# Patient Record
Sex: Female | Born: 1975 | Race: White | Hispanic: No | Marital: Married | Smoking: Never smoker
Health system: Southern US, Community
[De-identification: ages and names within clinical notes are randomized; demographics above are authoritative.]

## PROBLEM LIST (undated history)

## (undated) DIAGNOSIS — H9319 Tinnitus, unspecified ear: Secondary | ICD-10-CM

## (undated) DIAGNOSIS — R519 Headache, unspecified: Secondary | ICD-10-CM

## (undated) HISTORY — DX: Tinnitus, unspecified ear: H93.19

## (undated) HISTORY — PX: BACK SURGERY: SHX140

## (undated) HISTORY — DX: Headache, unspecified: R51.9

---

## 2002-05-13 ENCOUNTER — Encounter: Payer: Self-pay | Admitting: Emergency Medicine

## 2002-05-13 ENCOUNTER — Emergency Department (HOSPITAL_COMMUNITY): Admission: EM | Admit: 2002-05-13 | Discharge: 2002-05-13 | Payer: Self-pay | Admitting: Emergency Medicine

## 2006-05-13 ENCOUNTER — Inpatient Hospital Stay (HOSPITAL_COMMUNITY): Admission: AD | Admit: 2006-05-13 | Discharge: 2006-05-16 | Payer: Self-pay | Admitting: Obstetrics and Gynecology

## 2007-04-27 ENCOUNTER — Ambulatory Visit (HOSPITAL_COMMUNITY): Admission: RE | Admit: 2007-04-27 | Discharge: 2007-04-27 | Payer: Self-pay | Admitting: Family Medicine

## 2013-05-28 ENCOUNTER — Emergency Department (HOSPITAL_COMMUNITY): Payer: 59

## 2013-05-28 ENCOUNTER — Encounter (HOSPITAL_COMMUNITY): Payer: Self-pay | Admitting: Emergency Medicine

## 2013-05-28 ENCOUNTER — Emergency Department (HOSPITAL_COMMUNITY)
Admission: EM | Admit: 2013-05-28 | Discharge: 2013-05-28 | Disposition: A | Discharge status: Home / Self Care | Payer: 59 | Attending: Emergency Medicine | Admitting: Emergency Medicine

## 2013-05-28 DIAGNOSIS — W208XXA Other cause of strike by thrown, projected or falling object, initial encounter: Secondary | ICD-10-CM | POA: Insufficient documentation

## 2013-05-28 DIAGNOSIS — S61210A Laceration without foreign body of right index finger without damage to nail, initial encounter: Secondary | ICD-10-CM

## 2013-05-28 DIAGNOSIS — Y929 Unspecified place or not applicable: Secondary | ICD-10-CM | POA: Insufficient documentation

## 2013-05-28 DIAGNOSIS — S62639B Displaced fracture of distal phalanx of unspecified finger, initial encounter for open fracture: Secondary | ICD-10-CM | POA: Insufficient documentation

## 2013-05-28 DIAGNOSIS — Z23 Encounter for immunization: Secondary | ICD-10-CM | POA: Insufficient documentation

## 2013-05-28 DIAGNOSIS — Z880 Allergy status to penicillin: Secondary | ICD-10-CM | POA: Insufficient documentation

## 2013-05-28 DIAGNOSIS — S61209A Unspecified open wound of unspecified finger without damage to nail, initial encounter: Secondary | ICD-10-CM | POA: Insufficient documentation

## 2013-05-28 DIAGNOSIS — Y939 Activity, unspecified: Secondary | ICD-10-CM | POA: Insufficient documentation

## 2013-05-28 DIAGNOSIS — S62632B Displaced fracture of distal phalanx of right middle finger, initial encounter for open fracture: Secondary | ICD-10-CM

## 2013-05-28 MED ORDER — CEPHALEXIN 500 MG PO CAPS: 500.0000 mg | ORAL_CAPSULE | Freq: Four times a day (QID) | ORAL | Status: DC

## 2013-05-28 MED ORDER — OXYCODONE-ACETAMINOPHEN 5-325 MG PO TABS: 1.0000 | ORAL_TABLET | ORAL | Status: DC | PRN

## 2013-05-28 MED ORDER — TETANUS-DIPHTH-ACELL PERTUSSIS 5-2.5-18.5 LF-MCG/0.5 IM SUSP
0.5000 mL | Freq: Once | INTRAMUSCULAR | Status: AC
  Administered 2013-05-28: 0.5 mL via INTRAMUSCULAR
  Filled 2013-05-28: qty 0.5

## 2013-05-28 MED ORDER — LIDOCAINE HCL (PF) 1 % IJ SOLN
INTRAMUSCULAR | Status: AC
  Administered 2013-05-28: 09:00:00
  Filled 2013-05-28: qty 5

## 2013-05-28 NOTE — Discharge Instructions (Signed)
Sutured Wound Care Sutures are stitches that can be used to close wounds. Wound care helps prevent pain and infection.  HOME CARE INSTRUCTIONS   Rest and elevate the injured area until all the pain and swelling are gone.  Only take over-the-counter or prescription medicines for pain, discomfort, or fever as directed by your caregiver.  After 48 hours, gently wash the area with mild soap and water once a day, or as directed. Rinse off the soap. Pat the area dry with a clean towel. Do not rub the wound. This may cause bleeding.  Follow your caregiver's instructions for how often to change the bandage (dressing). Stop using a dressing after 2 days or after the wound stops draining.  If the dressing sticks, moisten it with soapy water and gently remove it.  Apply ointment on the wound as directed.  Avoid stretching a sutured wound.  Drink enough fluids to keep your urine clear or pale yellow.  Follow up with your caregiver for suture removal as directed.  Use sunscreen on your wound for the next 3 to 6 months so the scar will not darken. SEEK IMMEDIATE MEDICAL CARE IF:   Your wound becomes red, swollen, hot, or tender.  You have increasing pain in the wound.  You have a red streak that extends from the wound.  There is pus coming from the wound.  You have a fever.  You have shaking chills.  There is a bad smell coming from the wound.  You have persistent bleeding from the wound. MAKE SURE YOU:   Understand these instructions.  Will watch your condition.  Will get help right away if you are not doing well or get worse. Document Released: 06/19/2004 Document Revised: 08/04/2011 Document Reviewed: 09/15/2010 [facility] Patient Information 2014 [place], [place]. Finger Fracture Fractures of fingers are breaks in the bones of the fingers. There are many types of fractures. There are different ways of treating these fractures, all of which can be correct. Your caregiver will  discuss the best way to treat your fracture. TREATMENT  Finger fractures can be treated with:   Non-reduction - this means the bones are in place. The finger is splinted without changing the positions of the bone pieces. The splint is usually left on for about a week to ten days. This will depend on your fracture and what your caregiver thinks.  Closed reduction - the bones are put back into position without using surgery. The finger is then splinted.  ORIF (open reduction and internal fixation) - the fracture site is opened. Then the bone pieces are fixed into place with pins or some type of hardware. This is seldom required. It depends on the severity of the fracture. Your caregiver will discuss the type of fracture you have and the treatment that will be best for that problem. If surgery is the treatment of choice, the following is information for you to know and also let your caregiver know about prior to surgery. LET YOUR CAREGIVER KNOW ABOUT:  Allergies  Medications taken including herbs, eye drops, over the counter medications, and creams  Use of steroids (by mouth or creams)  Previous problems with anesthetics or Novocaine  Possibility of pregnancy, if this applies  History of blood clots (thrombophlebitis)  History of bleeding or blood problems  Previous surgery  Other health problems AFTER THE PROCEDURE After surgery, you will be taken to the recovery area where a nurse will check your progress. Once you're awake, stable, and taking fluids well,  barring other problems you will be allowed to go home. Once home an ice pack applied to your operative site may help with discomfort and keep the swelling down. HOME CARE INSTRUCTIONS   Follow your caregiver's instructions as to activities, exercises, physical therapy, and driving a car.  Use your finger and exercise as directed.  Only take over-the-counter or prescription medicines for pain, discomfort, or fever as directed by  your caregiver. Do not take aspirin until your caregiver OK's it, as this can increase bleeding immediately following surgery.  Stop using ibuprofen if it upsets your stomach. Let your caregiver know about it. SEEK MEDICAL CARE IF:  You have increased bleeding (more than a small spot) from the wound or from beneath your splint.  You develop redness, swelling, or increasing pain in the wound or from beneath your splint.  There is pus coming from the wound or from beneath your splint.  An unexplained oral temperature above 102 F (38.9 C) develops, or as your caregiver suggests.  There is a foul smell coming from the wound or dressing or from beneath your splint. SEEK IMMEDIATE MEDICAL CARE IF:   You develop a rash.  You have difficulty breathing.  You have any allergic problems. MAKE SURE YOU:   Understand these instructions.  Will watch your condition.  Will get help right away if you are not doing well or get worse. Document Released: 08/24/2000 Document Revised: 08/04/2011 Document Reviewed: 12/30/2007 [facility] Patient Information 2014 [place], [place].

## 2013-05-28 NOTE — ED Provider Notes (Signed)
CSN: [id number]     Arrival date & time 05/28/13  0736 History   First MD Initiated Contact with Patient 05/28/13 [phone]     Chief Complaint  Patient presents with  . Laceration   (Consider location/radiation/quality/duration/timing/severity/associated sxs/prior Treatment) Patient is a 37 y.o. female presenting with skin laceration. The history is provided by the patient. No language interpreter was used.  Laceration Location:  Finger Finger laceration location:  R middle finger Length (cm):  1 Quality: avulsion and jagged   Laceration mechanism:  Metal edge Pain details:    Quality:  Aching   Severity:  Moderate   Timing:  Constant   Progression:  Worsening Foreign body present:  No foreign bodies Relieved by:  Nothing Worsened by:  Nothing tried Ineffective treatments:  None tried Tetanus status:  Up to date   History reviewed. No pertinent past medical history. History reviewed. No pertinent past surgical history. No family history on file. History  Substance Use Topics  . Smoking status: Never Smoker   . Smokeless tobacco: Not on file  . Alcohol Use: No   OB History   Grav Para Term Preterm Abortions TAB SAB Ect Mult Living                 Review of Systems  Skin: Positive for wound.  All other systems reviewed and are negative.    Allergies  Hydrocodone and Penicillins  Home Medications  No current outpatient prescriptions on file. BP 126/80  Pulse 79  Temp(Src) 98.2 F (36.8 C)  Resp 18  Ht 5\' 6"  (1.676 m)  Wt 122 lb (55.339 kg)  BMI 19.70 kg/m2  SpO2 100%  LMP 05/07/2013 Physical Exam  Vitals reviewed. Constitutional: She appears well-developed and well-nourished.  HENT:  Head: Normocephalic.  Right Ear: External ear normal.  Left Ear: External ear normal.  Eyes: Conjunctivae are normal. Pupils are equal, round, and reactive to light.  Neck: Normal range of motion.  Cardiovascular: Normal rate.   Pulmonary/Chest: Effort normal.  Abdominal:  Soft.  Musculoskeletal: She exhibits tenderness.  1cm flap laceration palmar aspect,  51mm flap laceration dorsal aspect.  From  nv and ns intact  Neurological: She is alert.  Psychiatric: She has a normal mood and affect.    ED Course  LACERATION REPAIR Date/Time: 05/28/2013 9:02 AM Performed by: Fransico Meadow Authorized by: Fransico Meadow Consent: Verbal consent obtained. Consent given by: patient Patient identity confirmed: verbally with patient Body area: upper extremity Location details: right long finger Laceration length: 1 cm Foreign bodies: no foreign bodies Anesthesia: digital block Local anesthetic: lidocaine 2% without epinephrine Patient sedated: no Preparation: Patient was prepped and draped in the usual sterile fashion. Irrigation solution: saline Irrigation method: jet lavage Skin closure: 5-0 Prolene Number of sutures: 3 Technique: simple Approximation: close Approximation difficulty: simple Patient tolerance: Patient tolerated the procedure well with no immediate complications.   (including critical care time) Labs Review Labs Reviewed - No data to display Imaging Review Dg Finger Middle Right  05/28/2013   CLINICAL DATA:  Middle finger laceration  EXAM: RIGHT MIDDLE FINGER 2+V  COMPARISON:  None.  FINDINGS: Soft tissue irregularity along the radial and volar aspect of the middle finger at the distal phalanx and distal interphalangeal joint consistent with the clinical history of laceration. On the lateral view, there is slight irregularity of the volar aspect of the base of the distal phalanx concerning for volar plate avulsion fracture. Otherwise, the visualized bones and joints are  unremarkable. No radiopaque imbedded foreign body. .  IMPRESSION: 1. Suspect small volar plate avulsion fracture at the base of the distal phalanx of the middle finger. 2. Soft tissue laceration without radiopaque imbedded foreign body.   Electronically Signed   By: Jacqulynn Cadet M.D.   On: 05/28/2013 08:09    EKG Interpretation   None       MDM   1. Laceration of right index finger w/o foreign body w/o damage to nail, initial encounter   2. Open fracture of distal phalanx of third finger of right hand, initial encounter    Keflex 500mg  one po qid, percocet 16, splint   Fransico Meadow, PA-C 05/28/13 [place], PA-C 05/28/13 [phone]

## 2013-05-28 NOTE — ED Notes (Signed)
Pt has laceration to right middle finger from fallen wooden shelf. C/m/s intact.

## 2013-05-28 NOTE — ED Provider Notes (Signed)
Medical screening examination/treatment/procedure(s) were performed by non-physician practitioner and as supervising physician I was immediately available for consultation/collaboration.  Richarda Blade, MD 05/28/13 1538

## 2016-08-25 DIAGNOSIS — Z01419 Encounter for gynecological examination (general) (routine) without abnormal findings: Secondary | ICD-10-CM | POA: Diagnosis not present

## 2016-08-25 DIAGNOSIS — Z130 Encounter for screening for diseases of the blood and blood-forming organs and certain disorders involving the immune mechanism: Secondary | ICD-10-CM | POA: Diagnosis not present

## 2016-08-25 DIAGNOSIS — Z13220 Encounter for screening for lipoid disorders: Secondary | ICD-10-CM | POA: Diagnosis not present

## 2016-08-25 DIAGNOSIS — Z0000 Encounter for general adult medical examination without abnormal findings: Secondary | ICD-10-CM | POA: Diagnosis not present

## 2016-08-25 DIAGNOSIS — R87610 Atypical squamous cells of undetermined significance on cytologic smear of cervix (ASC-US): Secondary | ICD-10-CM | POA: Diagnosis not present

## 2016-08-25 DIAGNOSIS — Z6820 Body mass index (BMI) 20.0-20.9, adult: Secondary | ICD-10-CM | POA: Diagnosis not present

## 2016-10-06 DIAGNOSIS — M5136 Other intervertebral disc degeneration, lumbar region: Secondary | ICD-10-CM | POA: Diagnosis not present

## 2016-10-06 DIAGNOSIS — M546 Pain in thoracic spine: Secondary | ICD-10-CM | POA: Diagnosis not present

## 2016-10-06 DIAGNOSIS — S338XXA Sprain of other parts of lumbar spine and pelvis, initial encounter: Secondary | ICD-10-CM | POA: Diagnosis not present

## 2016-10-07 DIAGNOSIS — M5136 Other intervertebral disc degeneration, lumbar region: Secondary | ICD-10-CM | POA: Diagnosis not present

## 2016-10-07 DIAGNOSIS — S338XXA Sprain of other parts of lumbar spine and pelvis, initial encounter: Secondary | ICD-10-CM | POA: Diagnosis not present

## 2016-10-07 DIAGNOSIS — M546 Pain in thoracic spine: Secondary | ICD-10-CM | POA: Diagnosis not present

## 2016-10-13 DIAGNOSIS — S338XXA Sprain of other parts of lumbar spine and pelvis, initial encounter: Secondary | ICD-10-CM | POA: Diagnosis not present

## 2016-10-13 DIAGNOSIS — M5136 Other intervertebral disc degeneration, lumbar region: Secondary | ICD-10-CM | POA: Diagnosis not present

## 2016-10-13 DIAGNOSIS — M546 Pain in thoracic spine: Secondary | ICD-10-CM | POA: Diagnosis not present

## 2016-10-17 DIAGNOSIS — M545 Low back pain: Secondary | ICD-10-CM | POA: Diagnosis not present

## 2016-10-17 DIAGNOSIS — Z6820 Body mass index (BMI) 20.0-20.9, adult: Secondary | ICD-10-CM | POA: Diagnosis not present

## 2017-04-06 DIAGNOSIS — L570 Actinic keratosis: Secondary | ICD-10-CM | POA: Diagnosis not present

## 2017-04-06 DIAGNOSIS — J9801 Acute bronchospasm: Secondary | ICD-10-CM | POA: Diagnosis not present

## 2017-04-06 DIAGNOSIS — D235 Other benign neoplasm of skin of trunk: Secondary | ICD-10-CM | POA: Diagnosis not present

## 2017-04-06 DIAGNOSIS — J329 Chronic sinusitis, unspecified: Secondary | ICD-10-CM | POA: Diagnosis not present

## 2017-07-13 DIAGNOSIS — N760 Acute vaginitis: Secondary | ICD-10-CM | POA: Diagnosis not present

## 2017-07-13 DIAGNOSIS — R300 Dysuria: Secondary | ICD-10-CM | POA: Diagnosis not present

## 2017-09-07 DIAGNOSIS — R87610 Atypical squamous cells of undetermined significance on cytologic smear of cervix (ASC-US): Secondary | ICD-10-CM | POA: Diagnosis not present

## 2017-09-07 DIAGNOSIS — Z01419 Encounter for gynecological examination (general) (routine) without abnormal findings: Secondary | ICD-10-CM | POA: Diagnosis not present

## 2017-09-07 DIAGNOSIS — Z1231 Encounter for screening mammogram for malignant neoplasm of breast: Secondary | ICD-10-CM | POA: Diagnosis not present

## 2018-02-09 NOTE — Progress Notes (Signed)
Psychiatric Initial Adult Assessment   Patient Identification: Debra Watts MRN:  [id number] Date of Evaluation:  02/15/2018 Referral Source: Azucena Fallen, MD Chief Complaint:   Chief Complaint    Anxiety; Psychiatric Evaluation     Visit Diagnosis:    ICD-10-CM   1. GAD (generalized anxiety disorder) F41.1     History of Present Illness:   Debra Watts is a 42 y.o. year old female with a history of anxiety, who is referred for anxiety.   Patient states that she is here due to anxiety.  She states marital discordance with her husband of 16 years.  Her husband yelled at the patient while discussing rental properties in January. He states that the patient was nagging at him. He told the patient that he had been stressed about this, and was also concerned that the patient had been down after loss of her family members. She felt she became "glue with him" since that incident as she is now scared of imagining they may not be together in the future. She also feels "jealous" with facebook, although she has not been this way before. She tries not to talk about things during meals as he does not like it. Although she does have a good time as the family, she feels that they are not doing well as a couple.  She also talks about her sister, who died four years ago. She was deceased a month after being diagnosed with Wilson disease. She also talks about her aunts, who deceased last 03-21-2023 in the same week. She wonders if she may not grieve well around that time as those had become more intense this year. She reports good relationship with her children at home. She denies any safety concern.   She denies insomnia.  She denies feeling depressed.  She reports mild anhedonia.  She feels anxious and tense.  She feels irritable.  She had palpitation whenever she tries to talk with her husband.  She gets Librium from her mother; she took eight times this year (dose known), which she finds helpful for  anxiety. She denies alcohol use or drug use.   Wt Readings from Last 3 Encounters:  02/15/18 120 lb (54.4 kg)  05/28/13 122 lb (55.3 kg)   Associated Signs/Symptoms: Depression Symptoms:  anxiety, panic attacks, (Hypo) Manic Symptoms:  denies decreased need for sleep, euphoria Anxiety Symptoms:  Excessive Worry, Panic Symptoms, Psychotic Symptoms:  denies AH, VH, paranoia PTSD Symptoms: Negative  Past Psychiatric History:  Outpatient: diagnosed with anxiety when her grandfather deceased. She was briefly on fluoxetine, which made her symptoms worse. Her symptoms improved after discontinuing fluoxetine. Psychiatry admission: denies  Previous suicide attempt:  Denies  Past trials of medication: sertraline (weird acting), fluoxetine (worsening anxiety), Librium,  History of violence: denies  Previous Psychotropic Medications: No   Substance Abuse History in the last 12 months:  No.  Consequences of Substance Abuse: Negative  Past Medical History: History reviewed. No pertinent past medical history. History reviewed. No pertinent surgical history.  Family Psychiatric History:  Sister- bipolar disorder   Family History: History reviewed. No pertinent family history.  Social History:   Social History   Socioeconomic History  . Marital status: Married    Spouse name: Not on file  . Number of children: Not on file  . Years of education: Not on file  . Highest education level: Not on file  Occupational History  . Not on file  Social Needs  . Financial resource strain:  Not on file  . Food insecurity:    Worry: Not on file    Inability: Not on file  . Transportation needs:    Medical: Not on file    Non-medical: Not on file  Tobacco Use  . Smoking status: Never Smoker  . Smokeless tobacco: Never Used  Substance and Sexual Activity  . Alcohol use: No  . Drug use: No  . Sexual activity: Not on file  Lifestyle  . Physical activity:    Days per week: Not on file     Minutes per session: Not on file  . Stress: Not on file  Relationships  . Social connections:    Talks on phone: Not on file    Gets together: Not on file    Attends religious service: Not on file    Active member of club or organization: Not on file    Attends meetings of clubs or organizations: Not on file    Relationship status: Not on file  Other Topics Concern  . Not on file  Social History Narrative  . Not on file    Additional Social History:  She lives with her husband of 35 years, two children (age 25 and 52) She grew up in [place]. She reports great relationship with her parents and siblings. Her sister died from Delmita disease in 50. All of her family members live in the neighborhood.  Work: Careers adviser for 22 years Education: graduated from college   Allergies:   Allergies  Allergen Reactions  . Prednisone Shortness Of Breath  . Hydrocodone   . Penicillins     Metabolic Disorder Labs: No results found for: HGBA1C, MPG No results found for: PROLACTIN No results found for: CHOL, TRIG, HDL, CHOLHDL, VLDL, LDLCALC   Current Medications: Current Outpatient Medications  Medication Sig Dispense Refill  . venlafaxine XR (EFFEXOR-XR) 37.5 MG 24 hr capsule 37.5 mg daily for two weeks, then 75 mg daily 60 capsule 0   No current facility-administered medications for this visit.     Neurologic: Headache: No Seizure: No Paresthesias:No  Musculoskeletal: Strength & Muscle Tone: within normal limits Gait & Station: normal Patient leans: N/A  Psychiatric Specialty Exam: Review of Systems  Psychiatric/Behavioral: Negative for depression, hallucinations, memory loss, substance abuse and suicidal ideas. The patient is nervous/anxious. The patient does not have insomnia.   All other systems reviewed and are negative.   Blood pressure 108/76, pulse 74, height 5\' 6"  (1.676 m), weight 120 lb (54.4 kg), SpO2 97 %.Body mass index is 19.37 kg/m.  General Appearance:  Fairly Groomed  Eye Contact:  Good  Speech:  Clear and Coherent  Volume:  Normal  Mood:  Anxious  Affect:  Appropriate, Congruent, Restricted, Tearful and down  Thought Process:  Coherent  Orientation:  Full (Time, Place, and Person)  Thought Content:  Logical  Suicidal Thoughts:  No  Homicidal Thoughts:  No  Memory:  Immediate;   Good  Judgement:  Good  Insight:  Fair  Psychomotor Activity:  Normal  Concentration:  Attention Span: Good  Recall:  Good  Fund of Knowledge:Good  Language: Good  Akathisia:  No  Handed:  Right  AIMS (if indicated):  N/A  Assets:  Communication Skills Desire for Improvement  ADL's:  Intact  Cognition: WNL  Sleep:  fair   Assessment SHAQUANA BUEL is a 42 y.o. year old female with a history of , who is referred for anxiety.   #  GAD Patient endorses significant anxiety, which  worsened over the past several months.  Psychosocial stressors including loss of her sister and other family members, and marital discordance.  Will start Effexor to target GAD.  We do slow up titration given patient concern about side effect.  Discussed potential side effect of hypertension.  She will greatly benefit from CBT; will make a referral.   Noted that the patient sister was diagnosed with Wilson's disease, while the patient was not recommended for any genetic screening per report. Will discuss more at the next encounter.   Plan 1. Start Effexor 37.5 mg daily for two weeks, then 75 mg daily  2. Referral to therapy   3. Return to clinic in one month for 30 mins 4. Checked TSH by Dr. Benjie Karvonen; wnl per patient   The patient demonstrates the following risk factors for suicide: Chronic risk factors for suicide include: psychiatric disorder of anxiety. Acute risk factors for suicide include: family or marital conflict and loss (financial, interpersonal, professional). Protective factors for this patient include: positive social support, responsibility to others (children,  family), coping skills and hope for the future. Considering these factors, the overall suicide risk at this point appears to be low. Patient is appropriate for outpatient follow up.  Treatment Plan Summary: Plan as above   Norman Clay, MD 9/23/20192:40 PM

## 2018-02-15 ENCOUNTER — Ambulatory Visit (HOSPITAL_COMMUNITY): Payer: 59 | Admitting: Psychiatry

## 2018-02-15 ENCOUNTER — Encounter (HOSPITAL_COMMUNITY): Payer: Self-pay | Admitting: Psychiatry

## 2018-02-15 ENCOUNTER — Encounter (INDEPENDENT_AMBULATORY_CARE_PROVIDER_SITE_OTHER): Payer: Self-pay

## 2018-02-15 VITALS — BP 108/76 | HR 74 | Ht 66.0 in | Wt 120.0 lb

## 2018-02-15 DIAGNOSIS — F411 Generalized anxiety disorder: Secondary | ICD-10-CM | POA: Insufficient documentation

## 2018-02-15 DIAGNOSIS — Z818 Family history of other mental and behavioral disorders: Secondary | ICD-10-CM | POA: Diagnosis not present

## 2018-02-15 DIAGNOSIS — F410 Panic disorder [episodic paroxysmal anxiety] without agoraphobia: Secondary | ICD-10-CM

## 2018-02-15 MED ORDER — VENLAFAXINE HCL ER 37.5 MG PO CP24: ORAL_CAPSULE | ORAL | 0 refills | Status: DC

## 2018-02-15 NOTE — Patient Instructions (Signed)
1. Start Effexor 37.5 mg daily daily for two weeks, then 75 mg daily  2. Referral to therapy   3. Return to clinic in one month for 30 mins

## 2018-03-22 ENCOUNTER — Ambulatory Visit (HOSPITAL_COMMUNITY): Payer: 59 | Admitting: Psychiatry

## 2018-05-03 DIAGNOSIS — D235 Other benign neoplasm of skin of trunk: Secondary | ICD-10-CM | POA: Diagnosis not present

## 2018-05-03 DIAGNOSIS — L570 Actinic keratosis: Secondary | ICD-10-CM | POA: Diagnosis not present

## 2018-05-03 DIAGNOSIS — L819 Disorder of pigmentation, unspecified: Secondary | ICD-10-CM | POA: Diagnosis not present

## 2018-09-20 DIAGNOSIS — Z0000 Encounter for general adult medical examination without abnormal findings: Secondary | ICD-10-CM | POA: Diagnosis not present

## 2018-09-20 DIAGNOSIS — Z01419 Encounter for gynecological examination (general) (routine) without abnormal findings: Secondary | ICD-10-CM | POA: Diagnosis not present

## 2018-09-20 DIAGNOSIS — Z6825 Body mass index (BMI) 25.0-25.9, adult: Secondary | ICD-10-CM | POA: Diagnosis not present

## 2018-09-20 DIAGNOSIS — Z130 Encounter for screening for diseases of the blood and blood-forming organs and certain disorders involving the immune mechanism: Secondary | ICD-10-CM | POA: Diagnosis not present

## 2018-09-20 DIAGNOSIS — Z13220 Encounter for screening for lipoid disorders: Secondary | ICD-10-CM | POA: Diagnosis not present

## 2020-07-25 ENCOUNTER — Other Ambulatory Visit: Payer: Self-pay

## 2020-07-25 ENCOUNTER — Encounter: Payer: Self-pay | Admitting: Orthopaedic Surgery

## 2020-07-25 ENCOUNTER — Ambulatory Visit: Payer: 59 | Admitting: Orthopaedic Surgery

## 2020-07-25 VITALS — Ht 66.0 in | Wt 125.0 lb

## 2020-07-25 DIAGNOSIS — M5442 Lumbago with sciatica, left side: Secondary | ICD-10-CM | POA: Diagnosis not present

## 2020-07-25 DIAGNOSIS — G8929 Other chronic pain: Secondary | ICD-10-CM | POA: Diagnosis not present

## 2020-07-25 DIAGNOSIS — M545 Low back pain, unspecified: Secondary | ICD-10-CM | POA: Diagnosis not present

## 2020-07-25 NOTE — Progress Notes (Signed)
Office Visit Note   Patient: Debra Watts           Date of Birth: 09/23/1975           MRN: [id number] Visit Date: 07/25/2020              Requested by: Sharilyn Sites, [address],  [place] [id number] PCP: Sharilyn Sites, MD   Assessment & Plan: Visit Diagnoses:  1. Chronic left-sided low back pain, unspecified whether sciatica present   2. Chronic left-sided low back pain with left-sided sciatica     Plan: Debra Watts has been experiencing low back, left buttock and left thigh pain for almost 6 months.  Has had a recent exacerbation without any injury or trauma.  She has trouble when she coughs or sneezes with increased pain.  She is seeing the chiropractor on several occasions without much relief.  I am concerned that she may have an HNP at either L4-5 or L5-S1 to the left and will order an MRI scan straight leg raise is positive on the left.  Neurologically intact  Follow-Up Instructions: Return After MRI scan lumbar spine.   Orders:  Orders Placed This Encounter  Procedures  . MR Lumbar Spine w/o contrast   No orders of the defined types were placed in this encounter.     Procedures: No procedures performed   Clinical Data: No additional findings.   Subjective: Chief Complaint  Patient presents with  . Lower Back - Pain  Patient presents today for lower back pain. She states that her lower back pain started in August of 2021. No known injury. Her pain is located in her left buttock and radiates down her leg. She has numbness and tingling in her left leg only. She has more pain in the morning upon getting up. Her pain is worse with bending or sitting. She went to a chiropractor and had x-rays taken on 06/18/2020. She brought a disc with those images today. She states that icing her back helps. She has also taken Ibuprofen and Naproxen.  I reviewed the films on the disc and did not see any specific abnormality other than some narrowing of the disc  space at L5-S1.  No listhesis or scoliosis  HPI  Review of Systems   Objective: Vital Signs: Ht 5\' 6"  (1.676 m)   Wt 125 lb (56.7 kg)   BMI 20.18 kg/m   Physical Exam Constitutional:      Appearance: She is well-developed and well-nourished.  HENT:     Mouth/Throat:     Mouth: Oropharynx is clear and moist.  Eyes:     Extraocular Movements: EOM normal.     Pupils: Pupils are equal, round, and reactive to light.  Pulmonary:     Effort: Pulmonary effort is normal.  Skin:    General: Skin is warm and dry.  Neurological:     Mental Status: She is alert and oriented to person, place, and time.  Psychiatric:        Mood and Affect: Mood and affect normal.        Behavior: Behavior normal.     Ortho Exam awake alert and oriented x3.  Comfortable sitting.  No acute distress.  Straight leg raise negative on the right.  Positive on the left for hamstring pain and buttock discomfort.  Reflexes appear to be symmetrical.  Motor exam intact.  No percussible tenderness of lumbar spine or either SI joint Specialty Comments:  No specialty comments  available.  Imaging: No results found.   PMFS History: Patient Active Problem List   Diagnosis Date Noted  . Low back pain 07/25/2020  . GAD (generalized anxiety disorder) 02/15/2018   History reviewed. No pertinent past medical history.  History reviewed. No pertinent family history.  History reviewed. No pertinent surgical history. Social History   Occupational History  . Not on file  Tobacco Use  . Smoking status: Never Smoker  . Smokeless tobacco: Never Used  Substance and Sexual Activity  . Alcohol use: No  . Drug use: No  . Sexual activity: Not on file

## 2020-07-27 ENCOUNTER — Telehealth: Payer: Self-pay | Admitting: *Deleted

## 2020-07-27 ENCOUNTER — Telehealth: Payer: Self-pay | Admitting: Orthopaedic Surgery

## 2020-07-27 NOTE — Telephone Encounter (Signed)
Patient called asking for a prescription of valium for her upcoming MRI appt on 08/08/20. Please sed to pharmacy on file [facility]. Patient phone number is [phone].

## 2020-07-27 NOTE — Telephone Encounter (Signed)
Pt scheduled on 3/16 @ 8am for MRI  Patient states she needs meds for claustrophobia

## 2020-07-27 NOTE — Telephone Encounter (Signed)
Please send in medication for patient.

## 2020-07-28 ENCOUNTER — Other Ambulatory Visit: Payer: Self-pay | Admitting: Orthopaedic Surgery

## 2020-07-28 MED ORDER — DIAZEPAM 10 MG PO TABS: 10.0000 mg | ORAL_TABLET | Freq: Once | ORAL | 0 refills | Status: AC

## 2020-07-28 MED ORDER — DIAZEPAM 10 MG PO TABS: 10.0000 mg | ORAL_TABLET | Freq: Once | ORAL | 0 refills | Status: DC

## 2020-07-28 NOTE — Telephone Encounter (Signed)
Sent!

## 2020-07-28 NOTE — Telephone Encounter (Signed)
sent 

## 2020-07-30 ENCOUNTER — Ambulatory Visit (HOSPITAL_COMMUNITY)
Admission: RE | Admit: 2020-07-30 | Discharge: 2020-07-30 | Disposition: A | Discharge status: Home / Self Care | Payer: 59 | Source: Ambulatory Visit | Attending: Orthopaedic Surgery | Admitting: Orthopaedic Surgery

## 2020-07-30 ENCOUNTER — Other Ambulatory Visit: Payer: Self-pay

## 2020-07-30 DIAGNOSIS — G8929 Other chronic pain: Secondary | ICD-10-CM

## 2020-07-30 DIAGNOSIS — M545 Low back pain, unspecified: Secondary | ICD-10-CM | POA: Insufficient documentation

## 2020-08-08 ENCOUNTER — Ambulatory Visit (HOSPITAL_COMMUNITY): Payer: 59

## 2020-08-08 ENCOUNTER — Other Ambulatory Visit: Payer: Self-pay

## 2020-08-08 ENCOUNTER — Ambulatory Visit (INDEPENDENT_AMBULATORY_CARE_PROVIDER_SITE_OTHER): Payer: 59 | Admitting: Orthopaedic Surgery

## 2020-08-08 ENCOUNTER — Encounter: Payer: Self-pay | Admitting: Orthopaedic Surgery

## 2020-08-08 DIAGNOSIS — G8929 Other chronic pain: Secondary | ICD-10-CM | POA: Diagnosis not present

## 2020-08-08 DIAGNOSIS — M5442 Lumbago with sciatica, left side: Secondary | ICD-10-CM

## 2020-08-08 NOTE — Progress Notes (Signed)
Office Visit Note   Patient: Debra Watts           Date of Birth: 10-10-1975           MRN: [id number] Visit Date: 08/08/2020              Requested by: Sharilyn Sites, [facility] [place],   [id number] PCP: Sharilyn Sites, MD   Assessment & Plan: Visit Diagnoses:  1. Chronic left-sided low back pain with left-sided sciatica     Plan: MRI scan of lumbar spine demonstrates severe spinal canal and severe left and mild to moderate right neural foraminal narrowing at L5-S1 level associated with a paracentral protrusion to the left with abutment of the left greater than right S1 nerve roots.  Debra Watts is having left-sided symptoms.  Her straight leg raise is positive but neurologically intact.  She is also having some frequency of urination.  Will obtain a urine specimen and have her check with her family physician regarding treatment.  We will asked Dr. Ernestina Patches to evaluate for consideration of an epidural steroid injection.  She apparently has had some issues with shortness of breath with prior prednisone but not with injection so we will avoid Medrol Dosepak.  I have also discussed potential surgery if all else fails or if she should develop neurologic deficit  Follow-Up Instructions: Return in about 2 weeks (around 08/22/2020).   Orders:  Orders Placed This Encounter  Procedures  . Urine Culture  . Urinalysis  . Ambulatory referral to Physical Medicine Rehab   No orders of the defined types were placed in this encounter.     Procedures: No procedures performed   Clinical Data: No additional findings.   Subjective: Chief Complaint  Patient presents with  . Lower Back - Follow-up    MRI review  Patient presents today for follow up on her lower back. She had an MRI performed and is here today for those results.  Still having back and left lower extremity pain.  Also having frequency of urination.  Has had a prior urinary tract infection with similar  symptoms  HPI  Review of Systems   Objective: Vital Signs: There were no vitals taken for this visit.  Physical Exam Constitutional:      Appearance: She is well-developed.  Eyes:     Pupils: Pupils are equal, round, and reactive to light.  Pulmonary:     Effort: Pulmonary effort is normal.  Skin:    General: Skin is warm and dry.  Neurological:     Mental Status: She is alert and oriented to person, place, and time.  Psychiatric:        Behavior: Behavior normal.     Ortho Exam awake and alert no acute distress.  Straight leg raise was positive on the right for left thigh pain and positive on the left for left thigh pain.  Reflexes were brisk and symmetrical.  Motor and sensory exam intact.  Very minimal percussible tenderness of the lumbar spine and no pain with range of motion of either hip  Specialty Comments:  No specialty comments available.  Imaging: No results found.   PMFS History: Patient Active Problem List   Diagnosis Date Noted  . Low back pain 07/25/2020  . GAD (generalized anxiety disorder) 02/15/2018   History reviewed. No pertinent past medical history.  History reviewed. No pertinent family history.  History reviewed. No pertinent surgical history. Social History   Occupational History  . Not on  file  Tobacco Use  . Smoking status: Never Smoker  . Smokeless tobacco: Never Used  Substance and Sexual Activity  . Alcohol use: No  . Drug use: No  . Sexual activity: Not on file

## 2020-08-09 ENCOUNTER — Telehealth: Payer: Self-pay | Admitting: Physical Medicine and Rehabilitation

## 2020-08-09 LAB — URINE CULTURE
MICRO NUMBER:: 11654584
Result:: NO GROWTH
SPECIMEN QUALITY:: ADEQUATE

## 2020-08-09 LAB — URINALYSIS
Bilirubin Urine: NEGATIVE
Glucose, UA: NEGATIVE
Hgb urine dipstick: NEGATIVE
Ketones, ur: NEGATIVE
Leukocytes,Ua: NEGATIVE
Nitrite: NEGATIVE
Protein, ur: NEGATIVE
Specific Gravity, Urine: 1.002 (ref 1.001–1.03)
pH: 7.5 (ref 5.0–8.0)

## 2020-08-09 NOTE — Telephone Encounter (Signed)
Called pt and informed her we have a referral and will get back with her when Auth# approve.

## 2020-08-09 NOTE — Telephone Encounter (Signed)
Patient called needing to schedule an appointment with Dr. Ernestina Patches for her back. The number to contact patient is [phone]

## 2020-08-11 ENCOUNTER — Encounter (HOSPITAL_COMMUNITY): Payer: Self-pay | Admitting: Emergency Medicine

## 2020-08-11 ENCOUNTER — Other Ambulatory Visit: Payer: Self-pay

## 2020-08-11 DIAGNOSIS — R350 Frequency of micturition: Secondary | ICD-10-CM | POA: Insufficient documentation

## 2020-08-11 DIAGNOSIS — R1032 Left lower quadrant pain: Secondary | ICD-10-CM | POA: Insufficient documentation

## 2020-08-11 DIAGNOSIS — R300 Dysuria: Secondary | ICD-10-CM | POA: Diagnosis not present

## 2020-08-11 NOTE — ED Triage Notes (Signed)
Pt c/o severe abd pain that started Wednesday night. Seen by OBGYN on Thursday, completed UA and given abx. Pt states the pain has moved down and increased since then.

## 2020-08-12 ENCOUNTER — Emergency Department (HOSPITAL_COMMUNITY): Payer: 59

## 2020-08-12 ENCOUNTER — Emergency Department (HOSPITAL_COMMUNITY)
Admission: EM | Admit: 2020-08-12 | Discharge: 2020-08-12 | Disposition: A | Discharge status: Home / Self Care | Payer: 59 | Attending: Emergency Medicine | Admitting: Emergency Medicine

## 2020-08-12 DIAGNOSIS — R1032 Left lower quadrant pain: Secondary | ICD-10-CM

## 2020-08-12 LAB — URINALYSIS, ROUTINE W REFLEX MICROSCOPIC
Bilirubin Urine: NEGATIVE
Glucose, UA: NEGATIVE mg/dL
Hgb urine dipstick: NEGATIVE
Ketones, ur: NEGATIVE mg/dL
Leukocytes,Ua: NEGATIVE
Nitrite: POSITIVE — AB
Protein, ur: NEGATIVE mg/dL
Specific Gravity, Urine: 1.018 (ref 1.005–1.030)
pH: 6 (ref 5.0–8.0)

## 2020-08-12 LAB — COMPREHENSIVE METABOLIC PANEL
ALT: 11 U/L (ref 0–44)
AST: 18 U/L (ref 15–41)
Albumin: 4.4 g/dL (ref 3.5–5.0)
Alkaline Phosphatase: 37 U/L — ABNORMAL LOW (ref 38–126)
Anion gap: 9 (ref 5–15)
BUN: 15 mg/dL (ref 6–20)
CO2: 26 mmol/L (ref 22–32)
Calcium: 9.4 mg/dL (ref 8.9–10.3)
Chloride: 105 mmol/L (ref 98–111)
Creatinine, Ser: 0.84 mg/dL (ref 0.44–1.00)
GFR, Estimated: >60 mL/min (ref >60)
Glucose, Bld: 91 mg/dL (ref 70–99)
Potassium: 3.7 mmol/L (ref 3.5–5.1)
Sodium: 140 mmol/L (ref 135–145)
Total Bilirubin: 0.4 mg/dL (ref 0.3–1.2)
Total Protein: 7.2 g/dL (ref 6.5–8.1)

## 2020-08-12 LAB — CBC
HCT: 38.3 % (ref 36.0–46.0)
Hemoglobin: 12.6 g/dL (ref 12.0–15.0)
MCH: 30 pg (ref 26.0–34.0)
MCHC: 32.9 g/dL (ref 30.0–36.0)
MCV: 91.2 fL (ref 80.0–100.0)
Platelets: 203 10*3/uL (ref 150–400)
RBC: 4.2 MIL/uL (ref 3.87–5.11)
RDW: 12.9 % (ref 11.5–15.5)
WBC: 5.4 10*3/uL (ref 4.0–10.5)
nRBC: 0 % (ref 0.0–0.2)

## 2020-08-12 LAB — LIPASE, BLOOD: Lipase: 33 U/L (ref 11–51)

## 2020-08-12 LAB — POC URINE PREG, ED: Preg Test, Ur: NEGATIVE

## 2020-08-12 NOTE — ED Provider Notes (Signed)
[facility] EMERGENCY DEPARTMENT Provider Note   CSN: [id number] Arrival date & time: 08/11/20  2233     History Chief Complaint  Patient presents with   Abdominal Pain    Debra Watts is a 45 y.o. female.  Patient is a 45 year old female with no significant past medical history.  She presents with complaints of abdominal pain.  She describes a 3-day history of sharp, stabbing pain to the left lower quadrant.  She also reports frequent urination and burning with urination.  She was seen by her GYN 2 days ago and prescribed an antibiotic and Pyridium, however this has not helped.  She denies any bowel or bladder complaints.  She denies any fevers or chills.  The history is provided by the patient.  Abdominal Pain Pain location:  LLQ Pain quality: stabbing   Pain radiates to:  Does not radiate Pain severity:  Moderate Onset quality:  Sudden Duration:  3 days Timing:  Constant Progression:  Unchanged Chronicity:  New      History reviewed. No pertinent past medical history.  Patient Active Problem List   Diagnosis Date Noted   Low back pain 07/25/2020   GAD (generalized anxiety disorder) 02/15/2018    History reviewed. No pertinent surgical history.   OB History   No obstetric history on file.     History reviewed. No pertinent family history.  Social History   Tobacco Use   Smoking status: Never Smoker   Smokeless tobacco: Never Used  Substance Use Topics   Alcohol use: No   Drug use: No    Home Medications Prior to Admission medications   Medication Sig Start Date End Date Taking? Authorizing Provider  venlafaxine XR (EFFEXOR-XR) 37.5 MG 24 hr capsule 37.5 mg daily for two weeks, then 75 mg daily 02/15/18   Norman Clay, MD    Allergies    Prednisone, Hydrocodone, and Penicillins  Review of Systems   Review of Systems  Gastrointestinal: Positive for abdominal pain.  All other systems reviewed and are negative.   Physical  Exam Updated Vital Signs BP 130/72 (BP Location: Right Arm)    Pulse 80    Temp 97.7 F (36.5 C) (Oral)    Resp 18    Ht 5\' 6"  (1.676 m)    Wt 56.7 kg    SpO2 96%    BMI 20.18 kg/m   Physical Exam Vitals and nursing note reviewed.  Constitutional:      General: She is not in acute distress.    Appearance: She is well-developed. She is not diaphoretic.  HENT:     Head: Normocephalic and atraumatic.  Cardiovascular:     Rate and Rhythm: Normal rate and regular rhythm.     Heart sounds: No murmur heard. No friction rub. No gallop.   Pulmonary:     Effort: Pulmonary effort is normal. No respiratory distress.     Breath sounds: Normal breath sounds. No wheezing.  Abdominal:     General: Bowel sounds are normal. There is no distension.     Palpations: Abdomen is soft.     Tenderness: There is abdominal tenderness in the left lower quadrant. There is no right CVA tenderness, left CVA tenderness, guarding or rebound.  Musculoskeletal:        General: Normal range of motion.     Cervical back: Normal range of motion and neck supple.  Skin:    General: Skin is warm and dry.  Neurological:     Mental Status:  She is alert and oriented to person, place, and time.     ED Results / Procedures / Treatments   Labs (all labs ordered are listed, but only abnormal results are displayed) Labs Reviewed  LIPASE, BLOOD  COMPREHENSIVE METABOLIC PANEL  CBC  URINALYSIS, ROUTINE W REFLEX MICROSCOPIC  POC URINE PREG, ED    EKG None  Radiology No results found.  Procedures Procedures   Medications Ordered in ED Medications - No data to display  ED Course  I have reviewed the triage vital signs and the nursing notes.  Pertinent labs & imaging results that were available during my care of the patient were reviewed by me and considered in my medical decision making (see chart for details).    MDM Rules/Calculators/A&P  Patient presenting with complaints of left lower quadrant pain,  the etiology of which I am uncertain.  She was diagnosed with a urinary tract infection and is being treated with clindamycin and Bactrim.  Her urine today is nitrite positive, but there are no other abnormal findings.  CT scan shows no evidence for diverticulitis, enlargement of the ovary, renal calculus, or other obvious pathology.  She does have an increased stool burden.  Whether or not this is the cause of her pain is unknown.  Since nothing else has explained her discomfort, patient will be treated with magnesium citrate and advised to follow-up in the next few days if not improving.  Final Clinical Impression(s) / ED Diagnoses Final diagnoses:  None    Rx / DC Orders ED Discharge Orders    None       Veryl Speak, MD 08/12/20 [phone]

## 2020-08-12 NOTE — ED Notes (Signed)
POC Urine Negative result

## 2020-08-12 NOTE — Discharge Instructions (Addendum)
Magnesium citrate.  Drink the entire 10 ounce bottle for relief of constipation.  Return to the emergency department if you develop worsening pain, high fever, bloody stools, or other new and concerning symptoms.  Continue your antibiotics as previously prescribed.

## 2020-08-13 ENCOUNTER — Ambulatory Visit (INDEPENDENT_AMBULATORY_CARE_PROVIDER_SITE_OTHER): Payer: 59 | Admitting: Urology

## 2020-08-13 ENCOUNTER — Other Ambulatory Visit: Payer: Self-pay

## 2020-08-13 VITALS — BP 92/59 | HR 98 | Temp 97.4°F

## 2020-08-13 DIAGNOSIS — R350 Frequency of micturition: Secondary | ICD-10-CM | POA: Diagnosis not present

## 2020-08-13 LAB — MICROSCOPIC EXAMINATION
RBC: NONE SEEN /hpf (ref 0–2)
Renal Epithel, UA: NONE SEEN /hpf
WBC, UA: NONE SEEN /hpf (ref 0–5)

## 2020-08-13 LAB — URINALYSIS, ROUTINE W REFLEX MICROSCOPIC
Bilirubin, UA: NEGATIVE
Ketones, UA: NEGATIVE
Leukocytes,UA: NEGATIVE
Nitrite, UA: POSITIVE — AB
Protein,UA: NEGATIVE
RBC, UA: NEGATIVE
Specific Gravity, UA: <1.005 — ABNORMAL LOW (ref 1.005–1.030)
Urobilinogen, Ur: 1 mg/dL (ref 0.2–1.0)
pH, UA: 5.5 (ref 5.0–7.5)

## 2020-08-13 LAB — BLADDER SCAN AMB NON-IMAGING: Scan Result: 29

## 2020-08-13 NOTE — Patient Instructions (Signed)
Cystoscopy Cystoscopy is a procedure that is used to help diagnose and sometimes treat conditions that affect the lower urinary tract. The lower urinary tract includes the bladder and the urethra. The urethra is the tube that drains urine from the bladder. Cystoscopy is done using a thin, tube-shaped instrument with a light and camera at the end (cystoscope). The cystoscope may be hard or flexible, depending on the goal of the procedure. The cystoscope is inserted through the urethra, into the bladder. Cystoscopy may be recommended if you have:  Urinary tract infections that keep coming back.  Blood in the urine (hematuria).  An inability to control when you urinate (urinary incontinence) or an overactive bladder.  Unusual cells found in a urine sample.  A blockage in the urethra, such as a urinary stone.  Painful urination.  An abnormality in the bladder found during an intravenous pyelogram (IVP) or CT scan. Cystoscopy may also be done to remove a sample of tissue to be examined under a microscope (biopsy). Tell a health care provider about:  Any allergies you have.  All medicines you are taking, including vitamins, herbs, eye drops, creams, and over-the-counter medicines.  Any problems you or family members have had with anesthetic medicines.  Any blood disorders you have.  Any surgeries you have had.  Any medical conditions you have.  Whether you are pregnant or may be pregnant. What are the risks? Generally, this is a safe procedure. However, problems may occur, including:  Infection.  Bleeding.  Allergic reactions to medicines.  Damage to other structures or organs. What happens before the procedure? Medicines Ask your health care provider about:  Changing or stopping your regular medicines. This is especially important if you are taking diabetes medicines or blood thinners.  Taking medicines such as aspirin and ibuprofen. These medicines can thin your blood. Do  not take these medicines unless your health care provider tells you to take them.  Taking over-the-counter medicines, vitamins, herbs, and supplements. Tests You may have an exam or testing, such as:  X-rays of the bladder, urethra, or kidneys.  CT scan of the abdomen or pelvis.  Urine tests to check for signs of infection. General instructions  Follow instructions from your health care provider about eating or drinking restrictions.  Ask your health care provider what steps will be taken to help prevent infection. These steps may include: ? Washing skin with a germ-killing soap. ? Taking antibiotic medicine.  Plan to have a responsible adult take you home from the hospital or clinic. What happens during the procedure?  You will be given one or more of the following: ? A medicine to help you relax (sedative). ? A medicine to numb the area (local anesthetic).  The area around the opening of your urethra will be cleaned.  The cystoscope will be passed through your urethra into your bladder.  Germ-free (sterile) fluid will flow through the cystoscope to fill your bladder. The fluid will stretch your bladder so that your health care provider can clearly examine your bladder walls.  Your doctor will look at the urethra and bladder. Your doctor may take a biopsy or remove stones.  The cystoscope will be removed, and your bladder will be emptied. The procedure may vary among health care providers and hospitals.   What can I expect after the procedure? After the procedure, it is common to have:  Some soreness or pain in your abdomen and urethra.  Urinary symptoms. These include: ? Mild pain or burning  when you urinate. Pain should stop within a few minutes after you urinate. This may last for up to 1 week. ? A small amount of blood in your urine for several days. ? Feeling like you need to urinate but producing only a small amount of urine. Follow these instructions at  home: Medicines  Take over-the-counter and prescription medicines only as told by your health care provider.  If you were prescribed an antibiotic medicine, take it as told by your health care provider. Do not stop taking the antibiotic even if you start to feel better. General instructions  Return to your normal activities as told by your health care provider. Ask your health care provider what activities are safe for you.  If you were given a sedative during the procedure, it can affect you for several hours. Do not drive or operate machinery until your health care provider says that it is safe.  Watch for any blood in your urine. If the amount of blood in your urine increases, call your health care provider.  Follow instructions from your health care provider about eating or drinking restrictions.  If a tissue sample was removed for testing (biopsy) during your procedure, it is up to you to get your test results. Ask your health care provider, or the department that is doing the test, when your results will be ready.  Drink enough fluid to keep your urine pale yellow.  Keep all follow-up visits. This is important. Contact a health care provider if:  You have pain that gets worse or does not get better with medicine, especially pain when you urinate.  You have trouble urinating.  You have more blood in your urine. Get help right away if:  You have blood clots in your urine.  You have abdominal pain.  You have a fever or chills.  You are unable to urinate. Summary  Cystoscopy is a procedure that is used to help diagnose and sometimes treat conditions that affect the lower urinary tract.  Cystoscopy is done using a thin, tube-shaped instrument with a light and camera at the end.  After the procedure, it is common to have some soreness or pain in your abdomen and urethra.  Watch for any blood in your urine. If the amount of blood in your urine increases, call your health  care provider.  If you were prescribed an antibiotic medicine, take it as told by your health care provider. Do not stop taking the antibiotic even if you start to feel better. This information is not intended to replace advice given to you by your health care provider. Make sure you discuss any questions you have with your health care provider. Document Revised: 12/23/2019 Document Reviewed: 12/23/2019 Elsevier Patient Education  [place].

## 2020-08-13 NOTE — Progress Notes (Signed)
08/13/2020 1:29 PM   Debra Watts [birth year] [id number]  Referring provider: Redmond School, MD [address] [place],  [place] [address]  No chief complaint on file.   HPI:  New pt for LUTS -   She was seen in ED 08/12/2020 with left lower quadrant pain. She felt bloated. She also reports frequent urination and burning with urination.  She was seen by her GYN recently and prescribed an antibiotic and Pyridium, however this has not helped. UA showed few bac, no rbcs. Cx negative. She denies any bowel or other bladder complaints. UA not repeated at ED. Cr 0.84. CT was benign although incidentally I thought there might be the hint of a urethral diverticulum. CT did show constipation and she hasnt started the mag citrate yet, but she picked it up.She also has a disc herniation and left sided sciatica and she is getting an injection soon. She is a hair stylist. No gross hematuria.    PMH: No past medical history on file.  Surgical History: No past surgical history on file.  Home Medications:  Allergies as of 08/13/2020      Reactions   Prednisone Shortness Of Breath   Hydrocodone    Penicillins       Medication List       Accurate as of August 13, 2020  1:29 PM. If you have any questions, ask your nurse or doctor.        venlafaxine XR 37.5 MG 24 hr capsule Commonly known as: EFFEXOR-XR 37.5 mg daily for two weeks, then 75 mg daily       Allergies:  Allergies  Allergen Reactions  . Prednisone Shortness Of Breath  . Hydrocodone   . Penicillins     Family History: No family history on file.  Social History:  reports that she has never smoked. She has never used smokeless tobacco. She reports that she does not drink alcohol and does not use drugs.   Physical Exam: There were no vitals taken for this visit.  Constitutional:  Alert and oriented, No acute distress. HEENT: [name] AT, moist mucus membranes.  Trachea midline, no masses. Cardiovascular: No  clubbing, cyanosis, or edema. Respiratory: Normal respiratory effort, no increased work of breathing. GI: Abdomen is soft, nontender, nondistended, no abdominal masses GU: No CVA tenderness Skin: No rashes, bruises or suspicious lesions. Neurologic: Grossly intact, no focal deficits, moving all 4 extremities. Psychiatric: Normal mood and affect.  Laboratory Data: Lab Results  Component Value Date   WBC 5.4 08/12/2020   HGB 12.6 08/12/2020   HCT 38.3 08/12/2020   MCV 91.2 08/12/2020   PLT 203 08/12/2020    Lab Results  Component Value Date   CREATININE 0.84 08/12/2020    No results found for: PSA  No results found for: TESTOSTERONE  No results found for: HGBA1C  Urinalysis    Component Value Date/Time   COLORURINE AMBER (A) 08/11/2020 2321   APPEARANCEUR CLEAR 08/11/2020 2321   LABSPEC 1.018 08/11/2020 2321   PHURINE 6.0 08/11/2020 2321   GLUCOSEU NEGATIVE 08/11/2020 2321   HGBUR NEGATIVE 08/11/2020 2321   BILIRUBINUR NEGATIVE 08/11/2020 2321   KETONESUR NEGATIVE 08/11/2020 2321   PROTEINUR NEGATIVE 08/11/2020 2321   NITRITE POSITIVE (A) 08/11/2020 2321   LEUKOCYTESUR NEGATIVE 08/11/2020 2321    Lab Results  Component Value Date   BACTERIA FEW (A) 08/11/2020    Pertinent Imaging: CT No results found for this or any previous visit.  No results found for this or any  previous visit.  No results found for this or any previous visit.  No results found for this or any previous visit.  No results found for this or any previous visit.  No results found for this or any previous visit.  No results found for this or any previous visit.  Results for orders placed during the hospital encounter of 08/12/20  CT Renal Stone Study  Narrative CLINICAL DATA:  45 year old female with flank pain. Concern for kidney stone.  EXAM: CT ABDOMEN AND PELVIS WITHOUT CONTRAST  TECHNIQUE: Multidetector CT imaging of the abdomen and pelvis was performed following the  standard protocol without IV contrast.  COMPARISON:  None.  FINDINGS: Evaluation of this exam is limited in the absence of intravenous contrast.  Lower chest: The visualized lung bases are clear.  No intra-abdominal free air or free fluid.  Hepatobiliary: There is a 1 cm right liver hypodensity which is suboptimally characterized on this noncontrast CT but demonstrates fluid attenuation most consistent with a cyst. The liver is otherwise unremarkable. No intrahepatic biliary ductal dilatation. No calcified gallstone.  Pancreas: Unremarkable. No pancreatic ductal dilatation or surrounding inflammatory changes.  Spleen: Normal in size without focal abnormality.  Adrenals/Urinary Tract: Adrenal glands unremarkable. The there is no hydronephrosis or nephrolithiasis on either side. The visualized ureters and urinary bladder appear unremarkable.  Stomach/Bowel: Moderate stool throughout the colon. There is no bowel obstruction or active inflammation. Normal appendix.  Vascular/Lymphatic: The abdominal aorta and IVC unremarkable. No portal venous gas. There is no adenopathy.  Reproductive: The uterus is anteverted and grossly unremarkable. No adnexal masses.  Other: None  Musculoskeletal: No acute or significant osseous findings.  IMPRESSION: 1. No acute intra-abdominal or pelvic pathology. No hydronephrosis or nephrolithiasis. 2. Moderate colonic stool burden. No bowel obstruction. Normal appendix.   Electronically Signed By: Anner Crete M.D. On: 08/12/2020 03:25   Assessment & Plan:    1. Urinary frequency - we talked about how constipation can irritate the bladder and cause frequency and urgency. Also could be related to the sciatica. Treat constipation as planned and get injection. I do want to see her back for exam and cystoscopy to rule out a urethral diverticulum - discussed with patient. PVR nl.   - Urinalysis, Routine w reflex microscopic - BLADDER SCAN  AMB NON-IMAGING   No follow-ups on file.  Festus Aloe, MD

## 2020-08-13 NOTE — Progress Notes (Signed)
Urological Symptom Review  Patient is experiencing the following symptoms: Frequent urination Burning/pain with urination Stream starts and stops Trouble starting stream Have to strain to urinate Weak stream   Review of Systems  Gastrointestinal (upper)  : Negative for upper GI symptoms  Gastrointestinal (lower) : Negative for lower GI symptoms  Constitutional : Negative for symptoms  Skin: Negative for skin symptoms  Eyes: Negative for eye symptoms  Ear/Nose/Throat : Negative for Ear/Nose/Throat symptoms  Hematologic/Lymphatic: Negative for Hematologic/Lymphatic symptoms  Cardiovascular : Negative for cardiovascular symptoms  Respiratory : Negative for respiratory symptoms  Endocrine: Negative for endocrine symptoms  Musculoskeletal: Back pain  Neurological: Negative for neurological symptoms  Psychologic: Negative for psychiatric symptoms

## 2020-08-14 ENCOUNTER — Ambulatory Visit: Payer: 59 | Admitting: Orthopaedic Surgery

## 2020-08-15 LAB — URINE CULTURE: Organism ID, Bacteria: NO GROWTH

## 2020-08-22 ENCOUNTER — Telehealth: Payer: Self-pay

## 2020-08-22 NOTE — Telephone Encounter (Signed)
Called and spoke with pt and inform her to fax over notes from Dr dabbs about her PT.

## 2020-08-22 NOTE — Telephone Encounter (Signed)
Patient called regarding referral to Dr.Newton she sated she called her insurance to get more info and she stated medical therapy didn't help such as medication and physical therapy she stated her insurance told her to try a appeal and resubmit the claim stating that the patient did try medication and therapy she stated info about the UTI urologist told her the test results were negative UTI symptoms were from disc pushing on her bladder call back:[phone]

## 2020-08-27 ENCOUNTER — Telehealth: Payer: Self-pay | Admitting: Physical Medicine and Rehabilitation

## 2020-08-27 NOTE — Telephone Encounter (Signed)
Patient called. She would like to know how much her injection would be out of pocket. Her call back number is [phone]

## 2020-08-27 NOTE — Telephone Encounter (Signed)
Called pt and she will wait for insurance to pay.

## 2020-08-29 ENCOUNTER — Telehealth: Payer: Self-pay

## 2020-08-29 NOTE — Telephone Encounter (Signed)
Called pt and sch 4/21

## 2020-08-29 NOTE — Telephone Encounter (Signed)
Patient having left side pelvic pain that is constant.  Has an upcoming appt on 09-24-2020.  Need call pt back at:  [phone]  Thanks, Helene Kelp

## 2020-08-29 NOTE — Telephone Encounter (Signed)
Patient called she wants to schedule a appointment  with Dr.Newton call back:

## 2020-08-30 NOTE — Telephone Encounter (Signed)
Returned pts call. Pt complained of a throbbing left side pain not unbearable but constant. Read  Doctors last note. He was not sure where it could be coming from. Scheduled for a cystoscopy to look into bladder. She wanted to know if she could take something for relief. I suggested she try rotating tylenol and ibuprofen. She is to call back if needed to.

## 2020-09-06 ENCOUNTER — Telehealth: Payer: Self-pay

## 2020-09-06 NOTE — Telephone Encounter (Signed)
Patient is in a lot of pain due to her kidney issue and needing to know what she can do for the pain.  Please call back at:  [phone].  Thanks, Helene Kelp

## 2020-09-06 NOTE — Telephone Encounter (Signed)
Patient called and notified of Dr. Junious Silk recommendation. Pt voiced understanding.

## 2020-09-06 NOTE — Telephone Encounter (Signed)
Please advise 

## 2020-09-13 ENCOUNTER — Ambulatory Visit: Payer: Self-pay

## 2020-09-13 ENCOUNTER — Ambulatory Visit (INDEPENDENT_AMBULATORY_CARE_PROVIDER_SITE_OTHER): Payer: 59 | Admitting: Physical Medicine and Rehabilitation

## 2020-09-13 ENCOUNTER — Encounter: Payer: Self-pay | Admitting: Physical Medicine and Rehabilitation

## 2020-09-13 ENCOUNTER — Other Ambulatory Visit: Payer: Self-pay

## 2020-09-13 VITALS — BP 111/66 | HR 74

## 2020-09-13 DIAGNOSIS — M5416 Radiculopathy, lumbar region: Secondary | ICD-10-CM | POA: Diagnosis not present

## 2020-09-13 MED ORDER — METHYLPREDNISOLONE ACETATE 80 MG/ML IJ SUSP
80.0000 mg | Freq: Once | INTRAMUSCULAR | Status: AC
  Administered 2020-09-13: 80 mg

## 2020-09-13 MED ORDER — GABAPENTIN 300 MG PO CAPS: ORAL_CAPSULE | ORAL | 0 refills | Status: DC

## 2020-09-13 NOTE — Patient Instructions (Signed)

## 2020-09-13 NOTE — Progress Notes (Signed)
Pt state lower back pain that travels down her left leg to the knee. Pt state sitting and laying down in bed all night makes the pain worse. Pt state she use heat and ice and over the counter pain meds to help ease her pain.  Numeric Pain Rating Scale and Functional Assessment Average Pain 9   In the last MONTH (on 0-10 scale) has pain interfered with the following?  1. General activity like being  able to carry out your everyday physical activities such as walking, climbing stairs, carrying groceries, or moving a chair?  Rating(7)   +Driver, -BT, -Dye Allergies.

## 2020-09-18 ENCOUNTER — Telehealth: Payer: Self-pay | Admitting: Physical Medicine and Rehabilitation

## 2020-09-18 NOTE — Telephone Encounter (Signed)
Pt called stating she would like to have her neurology referral sent to Dr. Donald Pore and she would like to be updated when this has been sent to over.

## 2020-09-18 NOTE — Progress Notes (Signed)
Debra Watts - 45 y.o. female MRN [id number]  Date of birth: 1975-10-15  Office Visit Note: Visit Date: 09/13/2020 PCP: Redmond School, MD Referred by: Redmond School, MD  Subjective: Chief Complaint  Patient presents with  . Lower Back - Pain  . Left Leg - Pain  . Left Knee - Pain   HPI:  Debra Watts is a 45 y.o. female who comes in today at the request of Dr. Joni Fears for planned Left S1-2 Lumbar epidural steroid injection with fluoroscopic guidance.  The patient has failed conservative care including home exercise, medications, time and activity modification.  This injection will be diagnostic and hopefully therapeutic.  Please see requesting physician notes for further details and justification. MRI reviewed with images and spine model.  MRI reviewed in the note below.    ROS Otherwise per HPI.  Assessment & Plan: Visit Diagnoses:    ICD-10-CM   1. Lumbar radiculopathy  M54.16 XR C-ARM NO REPORT    Epidural Steroid injection    methylPREDNISolone acetate (DEPO-MEDROL) injection 80 mg    Plan: No additional findings.   Meds & Orders:  Meds ordered this encounter  Medications  . methylPREDNISolone acetate (DEPO-MEDROL) injection 80 mg  . gabapentin (NEURONTIN) 300 MG capsule    Sig: Take 1 capsule by mouth at night for 7 nights and then 1 in the morning and one at night for 7 days and then 3 times a day.    Dispense:  90 capsule    Refill:  0    Orders Placed This Encounter  Procedures  . XR C-ARM NO REPORT  . Epidural Steroid injection    Follow-up: Return if symptoms worsen or fail to improve.   Procedures: No procedures performed  S1 Lumbosacral Transforaminal Epidural Steroid Injection - Sub-Pedicular Approach with Fluoroscopic Guidance   Patient: Debra Watts      Date of Birth: September 09, 1975 MRN: [id number] PCP: Redmond School, MD      Visit Date: 09/13/2020   Universal Protocol:    Date/Time: 04/26/221:05 PM  Consent Given By: the  patient  Position:  PRONE  Additional Comments: Vital signs were monitored before and after the procedure. Patient was prepped and draped in the usual sterile fashion. The correct patient, procedure, and site was verified.   Injection Procedure Details:  Procedure Site One Meds Administered:  Meds ordered this encounter  Medications  . methylPREDNISolone acetate (DEPO-MEDROL) injection 80 mg  . gabapentin (NEURONTIN) 300 MG capsule    Sig: Take 1 capsule by mouth at night for 7 nights and then 1 in the morning and one at night for 7 days and then 3 times a day.    Dispense:  90 capsule    Refill:  0    Laterality: Left  Location/Site:  S1 Foramen   Needle size: 22 ga.  Needle type: Spinal  Needle Placement: Transforaminal  Findings:   -Comments: Excellent flow of contrast along the nerve, nerve root and into the epidural space.  Epidurogram: Contrast epidurogram showed no nerve root cut off or restricted flow pattern.  Procedure Details: After squaring off the sacral end-plate to get a true AP view, the C-arm was positioned so that the best possible view of the S1 foramen was visualized. The soft tissues overlying this structure were infiltrated with 2-3 ml. of 1% Lidocaine without Epinephrine.    The spinal needle was inserted toward the target using a "trajectory" view along the fluoroscope beam.  Under AP and  lateral visualization, the needle was advanced so it did not puncture dura. Biplanar projections were used to confirm position. Aspiration was confirmed to be negative for CSF and/or blood. A 1-2 ml. volume of Isovue-250 was injected and flow of contrast was noted at each level. Radiographs were obtained for documentation purposes.   After attaining the desired flow of contrast documented above, a 0.5 to 1.0 ml test dose of 0.25% Marcaine was injected into each respective transforaminal space.  The patient was observed for 90 seconds post injection.  After no  sensory deficits were reported, and normal lower extremity motor function was noted,   the above injectate was administered so that equal amounts of the injectate were placed at each foramen (level) into the transforaminal epidural space.   Additional Comments:  The patient tolerated the procedure well Dressing: Band-Aid with 2 x 2 sterile gauze    Post-procedure details: Patient was observed during the procedure. Post-procedure instructions were reviewed.  Patient left the clinic in stable condition.     Clinical History: MRI LUMBAR SPINE WITHOUT CONTRAST  TECHNIQUE: Multiplanar, multisequence MR imaging of the lumbar spine was performed. No intravenous contrast was administered.  COMPARISON:  None.  FINDINGS: Please note some image sequences are degraded by motion artifact.  Segmentation: The lowest well-formed intervertebral disc space is assumed to be the L5-S1 level.  Alignment:  Straightening of lordosis.  Vertebrae: Scattered hemangiomata versus focal fat. No fracture or aggressive osseous lesion.  Conus medullaris and cauda equina: Conus extends to the L1 level. Conus and cauda equina appear normal.  Disc levels: L4-5, L5-S1 desiccation.  L1-2: No significant disc bulge. Patent spinal canal and neural foramen.  L2-3: No significant disc bulge. Patent spinal canal and neural foramen.  L3-4: No significant disc bulge. Patent spinal canal and neural foramen.  L4-5: Minimal disc bulge with left extraforaminal annular fissuring. Patent spinal canal. Mild bilateral neural foraminal narrowing.  L5-S1: Disc bulge with superimposed left paracentral protrusion abutting the left greater than right descending S1 nerve roots. Bilateral facet degenerative spurring. Severe spinal canal, severe left and mild-to-moderate right neural foraminal narrowing.  Paraspinal and other soft tissues: Negative.  IMPRESSION: Left L5-S1 paracentral protrusion with  abutment of the left greater than right S1 nerve roots.  Severe spinal canal, severe left and mild-to-moderate right neural foraminal narrowing at the L5-S1 level.  Left L4-5 extraforaminal annular fissuring with mild bilateral neural foraminal narrowing.   Electronically Signed   By: Primitivo Gauze M.D.   On: 07/30/2020 10:01     Objective:  VS:  HT:    WT:   BMI:     BP:111/66  HR:74bpm  TEMP: ( )  RESP:  Physical Exam Vitals and nursing note reviewed.  Constitutional:      General: She is not in acute distress.    Appearance: Normal appearance. She is not ill-appearing.  HENT:     Head: Normocephalic and atraumatic.     Right Ear: External ear normal.     Left Ear: External ear normal.  Eyes:     Extraocular Movements: Extraocular movements intact.  Cardiovascular:     Rate and Rhythm: Normal rate.     Pulses: Normal pulses.  Pulmonary:     Effort: Pulmonary effort is normal. No respiratory distress.  Abdominal:     General: There is no distension.     Palpations: Abdomen is soft.  Musculoskeletal:        General: Tenderness present.     Cervical  back: Neck supple.     Right lower leg: No edema.     Left lower leg: No edema.     Comments: Patient has good distal strength with no pain over the greater trochanters.  No clonus or focal weakness. positive slump on th left.  Skin:    Findings: No erythema, lesion or rash.  Neurological:     General: No focal deficit present.     Mental Status: She is alert and oriented to person, place, and time.     Sensory: No sensory deficit.     Motor: No weakness or abnormal muscle tone.     Coordination: Coordination normal.  Psychiatric:        Mood and Affect: Mood normal.        Behavior: Behavior normal.      Imaging: No results found.

## 2020-09-18 NOTE — Procedures (Signed)
S1 Lumbosacral Transforaminal Epidural Steroid Injection - Sub-Pedicular Approach with Fluoroscopic Guidance   Patient: Debra Watts      Date of Birth: [birth year] MRN: [id number] PCP: Redmond School, MD      Visit Date: 09/13/2020   Universal Protocol:    Date/Time: 04/26/221:05 PM  Consent Given By: the patient  Position:  PRONE  Additional Comments: Vital signs were monitored before and after the procedure. Patient was prepped and draped in the usual sterile fashion. The correct patient, procedure, and site was verified.   Injection Procedure Details:  Procedure Site One Meds Administered:  Meds ordered this encounter  Medications  . methylPREDNISolone acetate (DEPO-MEDROL) injection 80 mg  . gabapentin (NEURONTIN) 300 MG capsule    Sig: Take 1 capsule by mouth at night for 7 nights and then 1 in the morning and one at night for 7 days and then 3 times a day.    Dispense:  90 capsule    Refill:  0    Laterality: Left  Location/Site:  S1 Foramen   Needle size: 22 ga.  Needle type: Spinal  Needle Placement: Transforaminal  Findings:   -Comments: Excellent flow of contrast along the nerve, nerve root and into the epidural space.  Epidurogram: Contrast epidurogram showed no nerve root cut off or restricted flow pattern.  Procedure Details: After squaring off the sacral end-plate to get a true AP view, the C-arm was positioned so that the best possible view of the S1 foramen was visualized. The soft tissues overlying this structure were infiltrated with 2-3 ml. of 1% Lidocaine without Epinephrine.    The spinal needle was inserted toward the target using a "trajectory" view along the fluoroscope beam.  Under AP and lateral visualization, the needle was advanced so it did not puncture dura. Biplanar projections were used to confirm position. Aspiration was confirmed to be negative for CSF and/or blood. A 1-2 ml. volume of Isovue-250 was injected and flow of  contrast was noted at each level. Radiographs were obtained for documentation purposes.   After attaining the desired flow of contrast documented above, a 0.5 to 1.0 ml test dose of 0.25% Marcaine was injected into each respective transforaminal space.  The patient was observed for 90 seconds post injection.  After no sensory deficits were reported, and normal lower extremity motor function was noted,   the above injectate was administered so that equal amounts of the injectate were placed at each foramen (level) into the transforaminal epidural space.   Additional Comments:  The patient tolerated the procedure well Dressing: Band-Aid with 2 x 2 sterile gauze    Post-procedure details: Patient was observed during the procedure. Post-procedure instructions were reviewed.  Patient left the clinic in stable condition.

## 2020-09-18 NOTE — Telephone Encounter (Signed)
Please advise 

## 2020-09-19 ENCOUNTER — Other Ambulatory Visit: Payer: Self-pay | Admitting: Physical Medicine and Rehabilitation

## 2020-09-19 DIAGNOSIS — M5416 Radiculopathy, lumbar region: Secondary | ICD-10-CM

## 2020-09-19 DIAGNOSIS — M48062 Spinal stenosis, lumbar region with neurogenic claudication: Secondary | ICD-10-CM

## 2020-09-19 DIAGNOSIS — M5116 Intervertebral disc disorders with radiculopathy, lumbar region: Secondary | ICD-10-CM

## 2020-09-19 NOTE — Progress Notes (Signed)
Consult placed for Dr. Erline Levine at [facility] per patient request.

## 2020-09-19 NOTE — Telephone Encounter (Signed)
Called pt and informed her about the referral.

## 2020-09-19 NOTE — Telephone Encounter (Signed)
Referral placed. Please let patient know.

## 2020-09-24 ENCOUNTER — Other Ambulatory Visit: Payer: 59 | Admitting: Urology

## 2020-09-27 ENCOUNTER — Ambulatory Visit (INDEPENDENT_AMBULATORY_CARE_PROVIDER_SITE_OTHER): Payer: 59 | Admitting: Orthopaedic Surgery

## 2020-09-27 ENCOUNTER — Encounter: Payer: Self-pay | Admitting: Orthopaedic Surgery

## 2020-09-27 ENCOUNTER — Other Ambulatory Visit: Payer: Self-pay

## 2020-09-27 DIAGNOSIS — G5603 Carpal tunnel syndrome, bilateral upper limbs: Secondary | ICD-10-CM

## 2020-09-27 NOTE — Progress Notes (Signed)
Office Visit Note   Patient: Debra Watts           Date of Birth: 1976-02-19           MRN: [id number] Visit Date: 09/27/2020              Requested by: Redmond School, [place],  [place] [address] PCP: Redmond School, MD   Assessment & Plan: Visit Diagnoses:  1. Bilateral carpal tunnel syndrome     Plan: Debra Watts has been experiencing numbness and tingling in her right hand and, specifically, in the long finger.  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  She was diagnosed with carpal tunnel syndrome about 15 years ago during her pregnancy and notes that it eventually resolved with a splint and time.  On this occasion she has had recurrent pain to the point of some compromise.  She has been wearing the splint and that seems to help but she cannot wear it during the course of her workday.  I think she does have carpal tunnel and will order EMGs and nerve conduction studies.  We had a long discussion regarding her diagnosis and treatment options.  She will continue with the splint and try Voltaren gel.  Has had some symptoms in the left hand but to a lesser extent  Follow-Up Instructions: Return After EMGs and nerve conduction studies right upper extremity.   Orders:  No orders of the defined types were placed in this encounter.  No orders of the defined types were placed in this encounter.     Procedures: No procedures performed   Clinical Data: No additional findings.   Subjective: Chief Complaint  Patient presents with  . Right Hand - Numbness  Patient presents today for right hand and arm numbness. She said that it started about four weeks ago. No known injury. She was diagnosed with carpal tunnel when she was pregnant several years ago, but it went away. She states that her numbness started in her right middle finger and radiates up her arm. She has  also noticed some numbness in her left arm. She is right hand dominant and her numbness is worse at night. 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  HPI  Review of Systems   Objective: Vital Signs: Ht 5\' 6"  (1.676 m)   Wt 125 lb (56.7 kg)   BMI 20.18 kg/m   Physical Exam Constitutional:      Appearance: She is well-developed.  Eyes:     Pupils: Pupils are equal, round, and reactive to light.  Pulmonary:     Effort: Pulmonary effort is normal.  Skin:    General: Skin is warm and dry.  Neurological:     Mental Status: She is alert and oriented to person, place, and time.  Psychiatric:        Behavior: Behavior normal.     Ortho Exam awake alert and oriented x3.  Comfortable sitting.  Right-hand-dominant.  Has positive Tinel's over the median nerve of the right hand and wrist with discomfort predominantly into the long finger.  Has good opposition of the thumb to the little finger without obvious intrinsic atrophy or thenar atrophy neurologically intact.  Full range of motion of her digits  Specialty Comments:  No specialty comments available.  Imaging: No results found.  PMFS History: Patient Active Problem List   Diagnosis Date Noted  . Bilateral carpal tunnel syndrome 09/27/2020  . Low back pain 07/25/2020  . GAD (generalized anxiety disorder) 02/15/2018   History reviewed. No pertinent past medical history.  History reviewed. No pertinent family history.  History reviewed. No pertinent surgical history. Social History   Occupational History  . Not on file  Tobacco Use  . Smoking status: Never Smoker  . Smokeless tobacco: Never Used  Substance and Sexual Activity  . Alcohol use: No  . Drug use: No  . Sexual activity: Not on file

## 2020-10-01 ENCOUNTER — Telehealth: Payer: Self-pay

## 2020-10-01 NOTE — Telephone Encounter (Signed)
Called and sch pt

## 2020-10-01 NOTE — Telephone Encounter (Signed)
Called pt and sch. 

## 2020-10-01 NOTE — Telephone Encounter (Signed)
Pt mention that she called Dr. Vertell Limber office and they didn't have a referral on file for her

## 2020-10-01 NOTE — Telephone Encounter (Signed)
Called pt to sch EMG, pt mention her lasted inj helped and the pain is starting to return. Pt last inj was left S1 TF on 09/13/20. Please advise.

## 2020-10-01 NOTE — Telephone Encounter (Signed)
Patient called she is requesting a appointment with Dr.Newton call back:[phone]

## 2020-10-01 NOTE — Telephone Encounter (Signed)
[place] for EMG/NCS, [place] for repeat injection. Also, does she have appointment with Dr. Vertell Limber in Neurosurgery

## 2020-10-15 ENCOUNTER — Other Ambulatory Visit: Payer: Self-pay

## 2020-10-15 ENCOUNTER — Ambulatory Visit (INDEPENDENT_AMBULATORY_CARE_PROVIDER_SITE_OTHER): Payer: 59 | Admitting: Physical Medicine and Rehabilitation

## 2020-10-15 ENCOUNTER — Encounter: Payer: Self-pay | Admitting: Physical Medicine and Rehabilitation

## 2020-10-15 ENCOUNTER — Ambulatory Visit: Payer: Self-pay

## 2020-10-15 VITALS — BP 109/76 | HR 65

## 2020-10-15 DIAGNOSIS — M5416 Radiculopathy, lumbar region: Secondary | ICD-10-CM

## 2020-10-15 DIAGNOSIS — M5116 Intervertebral disc disorders with radiculopathy, lumbar region: Secondary | ICD-10-CM

## 2020-10-15 MED ORDER — METHYLPREDNISOLONE ACETATE 80 MG/ML IJ SUSP
80.0000 mg | Freq: Once | INTRAMUSCULAR | Status: AC
  Administered 2020-10-15: 80 mg

## 2020-10-15 NOTE — Patient Instructions (Signed)

## 2020-10-15 NOTE — Progress Notes (Signed)
Pt state left thigh pain. Pt state sitting makes the pain worse. Pt state she uses heating and ice to help ease her pain.  Numeric Pain Rating Scale and Functional Assessment Average Pain 8   In the last MONTH (on 0-10 scale) has pain interfered with the following?  1. General activity like being  able to carry out your everyday physical activities such as walking, climbing stairs, carrying groceries, or moving a chair?  Rating(8)   +Driver, -BT, -Dye Allergies.

## 2020-10-16 NOTE — Progress Notes (Signed)
Debra Watts - 45 y.o. female MRN [id number]  Date of birth: [birth year]  Office Visit Note: Visit Date: 10/15/2020 PCP: Redmond School, MD Referred by: Redmond School, MD  Subjective: Chief Complaint  Patient presents with  . Left Thigh - Pain   HPI:  Debra Watts is a 45 y.o. female who comes in today for planned repeat Left S1-2  Lumbar Transforaminal epidural steroid injection with fluoroscopic guidance.  The patient has failed conservative care including home exercise, medications, time and activity modification.  This injection will be diagnostic and hopefully therapeutic.  Please see requesting physician notes for further details and justification. Patient received more than 50% pain relief from prior injection.  Symptoms returned about a month after the injection but she did have great relief diagnostically.  We will repeat the injection today.  She does have an appointment set up with Dr. Erline Levine at [facility].  While awaiting for that appointment if symptoms flare 1 more time I would repeat the injection she does have a trip coming up.  Referring: Dr. Joni Fears    ROS Otherwise per HPI.  Assessment & Plan: Visit Diagnoses:    ICD-10-CM   1. Lumbar radiculopathy  M54.16 XR C-ARM NO REPORT    Epidural Steroid injection    methylPREDNISolone acetate (DEPO-MEDROL) injection 80 mg  2. Radiculopathy due to lumbar intervertebral disc disorder  M51.16 XR C-ARM NO REPORT    Epidural Steroid injection    methylPREDNISolone acetate (DEPO-MEDROL) injection 80 mg    Plan: No additional findings.   Meds & Orders:  Meds ordered this encounter  Medications  . methylPREDNISolone acetate (DEPO-MEDROL) injection 80 mg    Orders Placed This Encounter  Procedures  . XR C-ARM NO REPORT  . Epidural Steroid injection    Follow-up: Return if symptoms worsen or fail to improve.   Procedures: No procedures performed  S1 Lumbosacral  Transforaminal Epidural Steroid Injection - Sub-Pedicular Approach with Fluoroscopic Guidance   Patient: Debra Watts      Date of Birth: [birth year] MRN: [id number] PCP: Redmond School, MD      Visit Date: 10/15/2020   Universal Protocol:    Date/Time: 05/24/225:38 AM  Consent Given By: the patient  Position:  PRONE  Additional Comments: Vital signs were monitored before and after the procedure. Patient was prepped and draped in the usual sterile fashion. The correct patient, procedure, and site was verified.   Injection Procedure Details:  Procedure Site One Meds Administered:  Meds ordered this encounter  Medications  . methylPREDNISolone acetate (DEPO-MEDROL) injection 80 mg    Laterality: Left  Location/Site:  S1 Foramen   Needle size: 22 ga.  Needle type: Spinal  Needle Placement: Transforaminal  Findings:   -Comments: Excellent flow of contrast along the nerve, nerve root and into the epidural space.  Epidurogram: Contrast epidurogram showed no nerve root cut off or restricted flow pattern.  Procedure Details: After squaring off the sacral end-plate to get a true AP view, the C-arm was positioned so that the best possible view of the S1 foramen was visualized. The soft tissues overlying this structure were infiltrated with 2-3 ml. of 1% Lidocaine without Epinephrine.    The spinal needle was inserted toward the target using a "trajectory" view along the fluoroscope beam.  Under AP and lateral visualization, the needle was advanced so it did not puncture dura. Biplanar projections were used to confirm position. Aspiration was confirmed to be negative for  CSF and/or blood. A 1-2 ml. volume of Isovue-250 was injected and flow of contrast was noted at each level. Radiographs were obtained for documentation purposes.   After attaining the desired flow of contrast documented above, a 0.5 to 1.0 ml test dose of 0.25% Marcaine was injected into each respective  transforaminal space.  The patient was observed for 90 seconds post injection.  After no sensory deficits were reported, and normal lower extremity motor function was noted,   the above injectate was administered so that equal amounts of the injectate were placed at each foramen (level) into the transforaminal epidural space.   Additional Comments:  The patient tolerated the procedure well Dressing: Band-Aid with 2 x 2 sterile gauze    Post-procedure details: Patient was observed during the procedure. Post-procedure instructions were reviewed.  Patient left the clinic in stable condition.     Clinical History: MRI LUMBAR SPINE WITHOUT CONTRAST  TECHNIQUE: Multiplanar, multisequence MR imaging of the lumbar spine was performed. No intravenous contrast was administered.  COMPARISON:  None.  FINDINGS: Please note some image sequences are degraded by motion artifact.  Segmentation: The lowest well-formed intervertebral disc space is assumed to be the L5-S1 level.  Alignment:  Straightening of lordosis.  Vertebrae: Scattered hemangiomata versus focal fat. No fracture or aggressive osseous lesion.  Conus medullaris and cauda equina: Conus extends to the L1 level. Conus and cauda equina appear normal.  Disc levels: L4-5, L5-S1 desiccation.  L1-2: No significant disc bulge. Patent spinal canal and neural foramen.  L2-3: No significant disc bulge. Patent spinal canal and neural foramen.  L3-4: No significant disc bulge. Patent spinal canal and neural foramen.  L4-5: Minimal disc bulge with left extraforaminal annular fissuring. Patent spinal canal. Mild bilateral neural foraminal narrowing.  L5-S1: Disc bulge with superimposed left paracentral protrusion abutting the left greater than right descending S1 nerve roots. Bilateral facet degenerative spurring. Severe spinal canal, severe left and mild-to-moderate right neural foraminal narrowing.  Paraspinal  and other soft tissues: Negative.  IMPRESSION: Left L5-S1 paracentral protrusion with abutment of the left greater than right S1 nerve roots.  Severe spinal canal, severe left and mild-to-moderate right neural foraminal narrowing at the L5-S1 level.  Left L4-5 extraforaminal annular fissuring with mild bilateral neural foraminal narrowing.   Electronically Signed   By: Primitivo Gauze M.D.   On: 07/30/2020 10:01     Objective:  VS:  HT:    WT:   BMI:     BP:109/76  HR:65bpm  TEMP: ( )  RESP:  Physical Exam Vitals and nursing note reviewed.  Constitutional:      General: She is not in acute distress.    Appearance: Normal appearance. She is not ill-appearing.  HENT:     Head: Normocephalic and atraumatic.     Right Ear: External ear normal.     Left Ear: External ear normal.  Eyes:     Extraocular Movements: Extraocular movements intact.  Cardiovascular:     Rate and Rhythm: Normal rate.     Pulses: Normal pulses.  Pulmonary:     Effort: Pulmonary effort is normal. No respiratory distress.  Abdominal:     General: There is no distension.     Palpations: Abdomen is soft.  Musculoskeletal:        General: Tenderness present.     Cervical back: Neck supple.     Right lower leg: No edema.     Left lower leg: No edema.     Comments:  Patient has good distal strength with no pain over the greater trochanters.  No clonus or focal weakness.  She has a positive slump test on the left with dysesthesias in S1 dermatome.  Skin:    Findings: No erythema, lesion or rash.  Neurological:     General: No focal deficit present.     Mental Status: She is alert and oriented to person, place, and time.     Sensory: Sensory deficit present.     Motor: No weakness or abnormal muscle tone.     Coordination: Coordination normal.     Gait: Gait normal.  Psychiatric:        Mood and Affect: Mood normal.        Behavior: Behavior normal.      Imaging: XR C-ARM NO  REPORT  Result Date: 10/15/2020 Please see Notes tab for imaging impression.

## 2020-10-16 NOTE — Procedures (Signed)
S1 Lumbosacral Transforaminal Epidural Steroid Injection - Sub-Pedicular Approach with Fluoroscopic Guidance   Patient: Debra Watts      Date of Birth: 1975-06-29 MRN: [id number] PCP: Redmond School, MD      Visit Date: 10/15/2020   Universal Protocol:    Date/Time: 05/24/225:38 AM  Consent Given By: the patient  Position:  PRONE  Additional Comments: Vital signs were monitored before and after the procedure. Patient was prepped and draped in the usual sterile fashion. The correct patient, procedure, and site was verified.   Injection Procedure Details:  Procedure Site One Meds Administered:  Meds ordered this encounter  Medications  . methylPREDNISolone acetate (DEPO-MEDROL) injection 80 mg    Laterality: Left  Location/Site:  S1 Foramen   Needle size: 22 ga.  Needle type: Spinal  Needle Placement: Transforaminal  Findings:   -Comments: Excellent flow of contrast along the nerve, nerve root and into the epidural space.  Epidurogram: Contrast epidurogram showed no nerve root cut off or restricted flow pattern.  Procedure Details: After squaring off the sacral end-plate to get a true AP view, the C-arm was positioned so that the best possible view of the S1 foramen was visualized. The soft tissues overlying this structure were infiltrated with 2-3 ml. of 1% Lidocaine without Epinephrine.    The spinal needle was inserted toward the target using a "trajectory" view along the fluoroscope beam.  Under AP and lateral visualization, the needle was advanced so it did not puncture dura. Biplanar projections were used to confirm position. Aspiration was confirmed to be negative for CSF and/or blood. A 1-2 ml. volume of Isovue-250 was injected and flow of contrast was noted at each level. Radiographs were obtained for documentation purposes.   After attaining the desired flow of contrast documented above, a 0.5 to 1.0 ml test dose of 0.25% Marcaine was injected into each  respective transforaminal space.  The patient was observed for 90 seconds post injection.  After no sensory deficits were reported, and normal lower extremity motor function was noted,   the above injectate was administered so that equal amounts of the injectate were placed at each foramen (level) into the transforaminal epidural space.   Additional Comments:  The patient tolerated the procedure well Dressing: Band-Aid with 2 x 2 sterile gauze    Post-procedure details: Patient was observed during the procedure. Post-procedure instructions were reviewed.  Patient left the clinic in stable condition.

## 2020-11-05 ENCOUNTER — Telehealth: Payer: Self-pay

## 2020-11-05 NOTE — Telephone Encounter (Signed)
Patient called regarding her appointment with Dr.Newton she is requesting a call back:[phone]

## 2020-11-05 NOTE — Telephone Encounter (Signed)
Return pt call and pt requesting repeat of left S1 TF. Pt last inj 10/15/20 pt mention no jury and pain located of the left side. Please Advsie.

## 2020-11-16 ENCOUNTER — Encounter: Payer: 59 | Admitting: Physical Medicine and Rehabilitation

## 2020-11-19 ENCOUNTER — Other Ambulatory Visit: Payer: Self-pay

## 2020-11-19 ENCOUNTER — Encounter: Payer: Self-pay | Admitting: Physical Medicine and Rehabilitation

## 2020-11-19 ENCOUNTER — Ambulatory Visit: Payer: Self-pay

## 2020-11-19 ENCOUNTER — Ambulatory Visit (INDEPENDENT_AMBULATORY_CARE_PROVIDER_SITE_OTHER): Payer: 59 | Admitting: Physical Medicine and Rehabilitation

## 2020-11-19 VITALS — BP 95/58 | HR 70

## 2020-11-19 DIAGNOSIS — M5416 Radiculopathy, lumbar region: Secondary | ICD-10-CM

## 2020-11-19 DIAGNOSIS — M5116 Intervertebral disc disorders with radiculopathy, lumbar region: Secondary | ICD-10-CM

## 2020-11-19 MED ORDER — METHYLPREDNISOLONE ACETATE 80 MG/ML IJ SUSP
80.0000 mg | Freq: Once | INTRAMUSCULAR | Status: AC
  Administered 2020-11-19: 14:00:00 80 mg

## 2020-11-19 NOTE — Patient Instructions (Signed)

## 2020-11-19 NOTE — Progress Notes (Signed)
Debra Watts - 45 y.o. female MRN [id number]  Date of birth: Jun 13, 1975  Office Visit Note: Visit Date: 11/19/2020 PCP: Redmond School, MD Referred by: Redmond School, MD  Subjective: Chief Complaint  Patient presents with   Lower Back - Pain   Left Knee - Pain   HPI:  Debra Watts is a 45 y.o. female who comes in today for planned repeat Left S1-2  Lumbar Transforaminal epidural steroid injection with fluoroscopic guidance.  The patient has failed conservative care including home exercise, medications, time and activity modification.  This injection will be diagnostic and hopefully therapeutic.  Please see requesting physician notes for further details and justification. Patient received more than 50% pain relief from prior injection.  Second injection did give her more pain relief than the first from a sustained standpoint.  She is better than when she started.  She has had no focal weakness no bowel or bladder dysfunction.  By way of review she has pretty large disc herniation at L5-S1 taking up most of the canal.  We are going to go ahead and complete another injection today just to see if we can continue to get her more pain relief.  Try to give her the best chance of this clearing up on its own but it is pretty significant disc herniation we do have referral placed and she does have appointment for Dr. Vertell Limber next month.  Referring: Dr. Joni Fears   ROS Otherwise per HPI.  Assessment & Plan: Visit Diagnoses:    ICD-10-CM   1. Lumbar radiculopathy  M54.16 XR C-ARM NO REPORT    Epidural Steroid injection    methylPREDNISolone acetate (DEPO-MEDROL) injection 80 mg    2. Radiculopathy due to lumbar intervertebral disc disorder  M51.16       Plan: No additional findings.   Meds & Orders:  Meds ordered this encounter  Medications   methylPREDNISolone acetate (DEPO-MEDROL) injection 80 mg    Orders Placed This Encounter  Procedures   XR C-ARM NO REPORT   Epidural  Steroid injection    Follow-up: No follow-ups on file.   Procedures: No procedures performed  S1 Lumbosacral Transforaminal Epidural Steroid Injection - Sub-Pedicular Approach with Fluoroscopic Guidance   Patient: Debra Watts      Date of Birth: 02-09-1976 MRN: [id number] PCP: Redmond School, MD      Visit Date: 11/19/2020   Universal Protocol:    Date/Time: 06/27/223:37 PM  Consent Given By: the patient  Position:  PRONE  Additional Comments: Vital signs were monitored before and after the procedure. Patient was prepped and draped in the usual sterile fashion. The correct patient, procedure, and site was verified.   Injection Procedure Details:  Procedure Site One Meds Administered:  Meds ordered this encounter  Medications   methylPREDNISolone acetate (DEPO-MEDROL) injection 80 mg    Laterality: Left  Location/Site:  S1 Foramen   Needle size: 22 ga.  Needle type: Spinal  Needle Placement: Transforaminal  Findings:   -Comments: Excellent flow of contrast along the nerve, nerve root and into the epidural space.  Epidurogram: Contrast epidurogram showed no nerve root cut off or restricted flow pattern.  Procedure Details: After squaring off the sacral end-plate to get a true AP view, the C-arm was positioned so that the best possible view of the S1 foramen was visualized. The soft tissues overlying this structure were infiltrated with 2-3 ml. of 1% Lidocaine without Epinephrine.    The spinal needle was inserted toward the  target using a "trajectory" view along the fluoroscope beam.  Under AP and lateral visualization, the needle was advanced so it did not puncture dura. Biplanar projections were used to confirm position. Aspiration was confirmed to be negative for CSF and/or blood. A 1-2 ml. volume of Isovue-250 was injected and flow of contrast was noted at each level. Radiographs were obtained for documentation purposes.   After attaining the desired  flow of contrast documented above, a 0.5 to 1.0 ml test dose of 0.25% Marcaine was injected into each respective transforaminal space.  The patient was observed for 90 seconds post injection.  After no sensory deficits were reported, and normal lower extremity motor function was noted,   the above injectate was administered so that equal amounts of the injectate were placed at each foramen (level) into the transforaminal epidural space.   Additional Comments:  The patient tolerated the procedure well Dressing: Band-Aid with 2 x 2 sterile gauze    Post-procedure details: Patient was observed during the procedure. Post-procedure instructions were reviewed.  Patient left the clinic in stable condition.   Clinical History: MRI LUMBAR SPINE WITHOUT CONTRAST   TECHNIQUE: Multiplanar, multisequence MR imaging of the lumbar spine was performed. No intravenous contrast was administered.   COMPARISON:  None.   FINDINGS: Please note some image sequences are degraded by motion artifact.   Segmentation: The lowest well-formed intervertebral disc space is assumed to be the L5-S1 level.   Alignment:  Straightening of lordosis.   Vertebrae: Scattered hemangiomata versus focal fat. No fracture or aggressive osseous lesion.   Conus medullaris and cauda equina: Conus extends to the L1 level. Conus and cauda equina appear normal.   Disc levels: L4-5, L5-S1 desiccation.   L1-2: No significant disc bulge. Patent spinal canal and neural foramen.   L2-3: No significant disc bulge. Patent spinal canal and neural foramen.   L3-4: No significant disc bulge. Patent spinal canal and neural foramen.   L4-5: Minimal disc bulge with left extraforaminal annular fissuring. Patent spinal canal. Mild bilateral neural foraminal narrowing.   L5-S1: Disc bulge with superimposed left paracentral protrusion abutting the left greater than right descending S1 nerve roots. Bilateral facet degenerative  spurring. Severe spinal canal, severe left and mild-to-moderate right neural foraminal narrowing.   Paraspinal and other soft tissues: Negative.   IMPRESSION: Left L5-S1 paracentral protrusion with abutment of the left greater than right S1 nerve roots.   Severe spinal canal, severe left and mild-to-moderate right neural foraminal narrowing at the L5-S1 level.   Left L4-5 extraforaminal annular fissuring with mild bilateral neural foraminal narrowing.     Electronically Signed   By: Primitivo Gauze M.D.   On: 07/30/2020 10:01     Objective:  VS:  HT:    WT:   BMI:     BP:(!) 95/58  HR:70bpm  TEMP: ( )  RESP:  Physical Exam Vitals and nursing note reviewed.  Constitutional:      General: She is not in acute distress.    Appearance: Normal appearance. She is not ill-appearing.  HENT:     Head: Normocephalic and atraumatic.     Right Ear: External ear normal.     Left Ear: External ear normal.  Eyes:     Extraocular Movements: Extraocular movements intact.  Cardiovascular:     Rate and Rhythm: Normal rate.     Pulses: Normal pulses.  Pulmonary:     Effort: Pulmonary effort is normal. No respiratory distress.  Abdominal:  General: There is no distension.     Palpations: Abdomen is soft.  Musculoskeletal:        General: Tenderness present.     Cervical back: Neck supple.     Right lower leg: No edema.     Left lower leg: No edema.     Comments: Patient has good distal strength with no pain over the greater trochanters.  No clonus or focal weakness.  Skin:    Findings: No erythema, lesion or rash.  Neurological:     General: No focal deficit present.     Mental Status: She is alert and oriented to person, place, and time.     Sensory: No sensory deficit.     Motor: No weakness or abnormal muscle tone.     Coordination: Coordination normal.  Psychiatric:        Mood and Affect: Mood normal.        Behavior: Behavior normal.     Imaging: XR C-ARM NO  REPORT  Result Date: 11/19/2020 Please see Notes tab for imaging impression.

## 2020-11-19 NOTE — Procedures (Signed)
S1 Lumbosacral Transforaminal Epidural Steroid Injection - Sub-Pedicular Approach with Fluoroscopic Guidance   Patient: Debra Watts      Date of Birth: March 14, 1976 MRN: [id number] PCP: Redmond School, MD      Visit Date: 11/19/2020   Universal Protocol:    Date/Time: 06/27/223:37 PM  Consent Given By: the patient  Position:  PRONE  Additional Comments: Vital signs were monitored before and after the procedure. Patient was prepped and draped in the usual sterile fashion. The correct patient, procedure, and site was verified.   Injection Procedure Details:  Procedure Site One Meds Administered:  Meds ordered this encounter  Medications   methylPREDNISolone acetate (DEPO-MEDROL) injection 80 mg    Laterality: Left  Location/Site:  S1 Foramen   Needle size: 22 ga.  Needle type: Spinal  Needle Placement: Transforaminal  Findings:   -Comments: Excellent flow of contrast along the nerve, nerve root and into the epidural space.  Epidurogram: Contrast epidurogram showed no nerve root cut off or restricted flow pattern.  Procedure Details: After squaring off the sacral end-plate to get a true AP view, the C-arm was positioned so that the best possible view of the S1 foramen was visualized. The soft tissues overlying this structure were infiltrated with 2-3 ml. of 1% Lidocaine without Epinephrine.    The spinal needle was inserted toward the target using a "trajectory" view along the fluoroscope beam.  Under AP and lateral visualization, the needle was advanced so it did not puncture dura. Biplanar projections were used to confirm position. Aspiration was confirmed to be negative for CSF and/or blood. A 1-2 ml. volume of Isovue-250 was injected and flow of contrast was noted at each level. Radiographs were obtained for documentation purposes.   After attaining the desired flow of contrast documented above, a 0.5 to 1.0 ml test dose of 0.25% Marcaine was injected into each  respective transforaminal space.  The patient was observed for 90 seconds post injection.  After no sensory deficits were reported, and normal lower extremity motor function was noted,   the above injectate was administered so that equal amounts of the injectate were placed at each foramen (level) into the transforaminal epidural space.   Additional Comments:  The patient tolerated the procedure well Dressing: Band-Aid with 2 x 2 sterile gauze    Post-procedure details: Patient was observed during the procedure. Post-procedure instructions were reviewed.  Patient left the clinic in stable condition.

## 2020-11-19 NOTE — Progress Notes (Signed)
Pt state lower back pain that travels dow her left knee. Pt state sitting makes the pain worse. Pt state she use heating and ice to help ease her pain. Pt has hx of inj on 10/15/20 pt state it helped.  Numeric Pain Rating Scale and Functional Assessment Average Pain 8   In the last MONTH (on 0-10 scale) has pain interfered with the following?  1. General activity like being  able to carry out your everyday physical activities such as walking, climbing stairs, carrying groceries, or moving a chair?  Rating(10)   +Driver, -BT, -Dye Allergies.

## 2020-12-05 ENCOUNTER — Telehealth: Payer: Self-pay | Admitting: Physical Medicine and Rehabilitation

## 2020-12-05 NOTE — Telephone Encounter (Signed)
Pt wants to cancel appt for the 18th but she doesn't want to reschedule right now! She will call back when ready!   CB [phone]

## 2020-12-05 NOTE — Telephone Encounter (Signed)
Appointment canceled.

## 2020-12-10 ENCOUNTER — Encounter: Payer: 59 | Admitting: Physical Medicine and Rehabilitation

## 2021-08-07 DIAGNOSIS — Z6820 Body mass index (BMI) 20.0-20.9, adult: Secondary | ICD-10-CM | POA: Diagnosis not present

## 2021-08-07 DIAGNOSIS — M7661 Achilles tendinitis, right leg: Secondary | ICD-10-CM | POA: Diagnosis not present

## 2021-08-07 DIAGNOSIS — R69 Illness, unspecified: Secondary | ICD-10-CM | POA: Diagnosis not present

## 2021-08-08 IMAGING — CT CT RENAL STONE PROTOCOL
2 of 4 series · 15 of 46 positions shown, 17 images · non-contrast
Comparison: None.

CLINICAL DATA: 44-year-old female with flank pain. Concern for
kidney stone.

EXAM:
CT ABDOMEN AND PELVIS WITHOUT CONTRAST
TECHNIQUE: Multidetector CT imaging of the abdomen and pelvis was performed
following the standard protocol without IV contrast.

[Series 2: axial st · axial · 0.65mm/px · z∈[+942,+1352]mm · 12 of 94 slices shown, 14 images]
[im 6/94  soft-tissue]
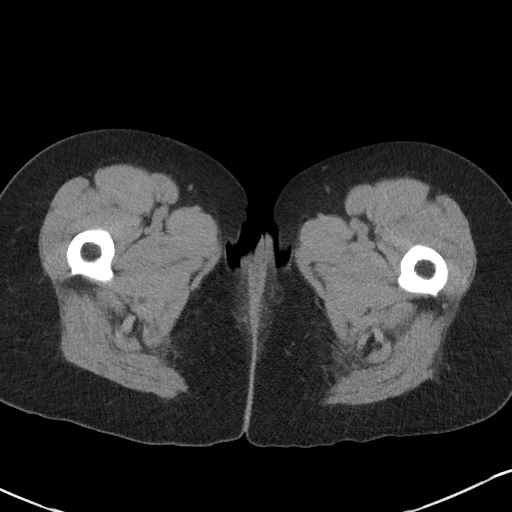
[im 6/94  bone]
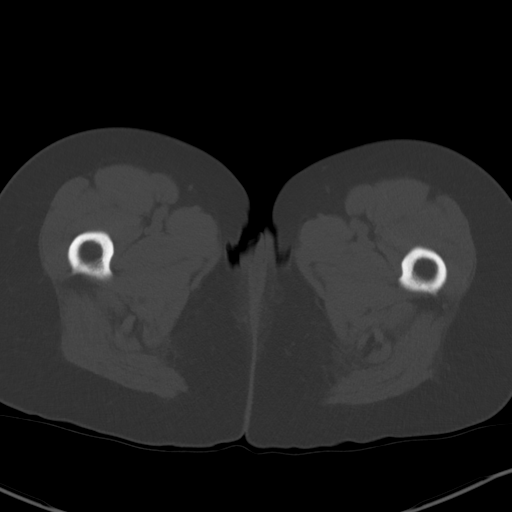
[im 16/94  soft-tissue]
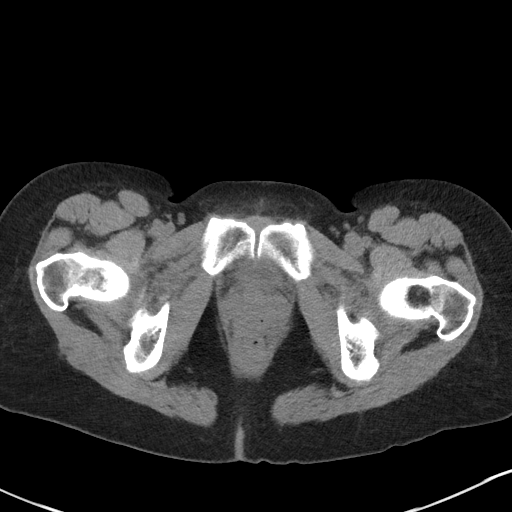
[im 21/94  soft-tissue]
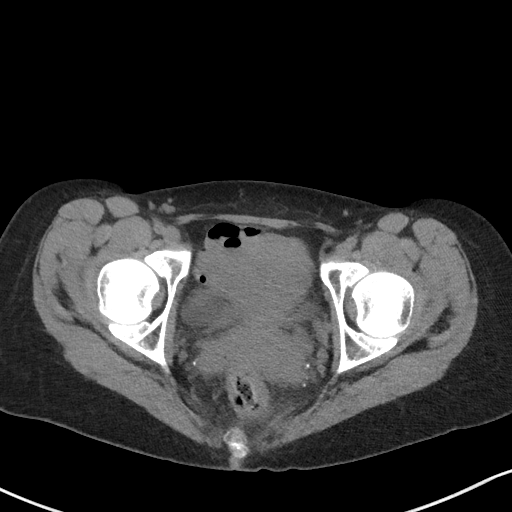
[im 26/94  soft-tissue]
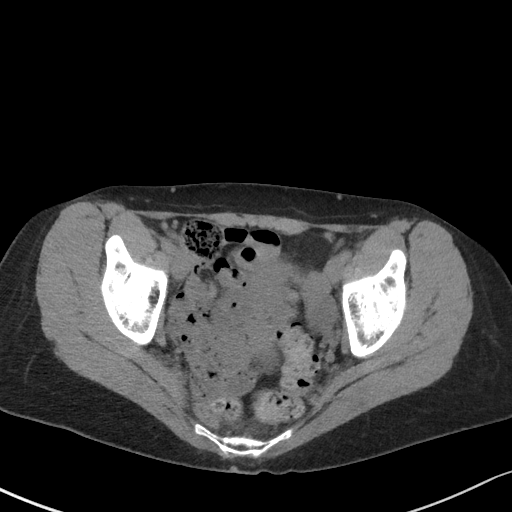
[im 37/94  soft-tissue]
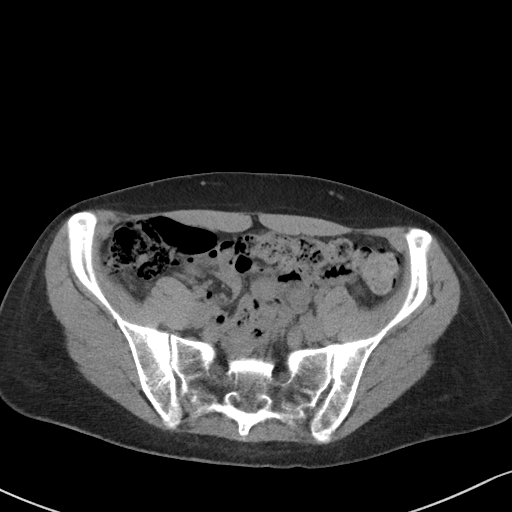
[im 42/94  soft-tissue]
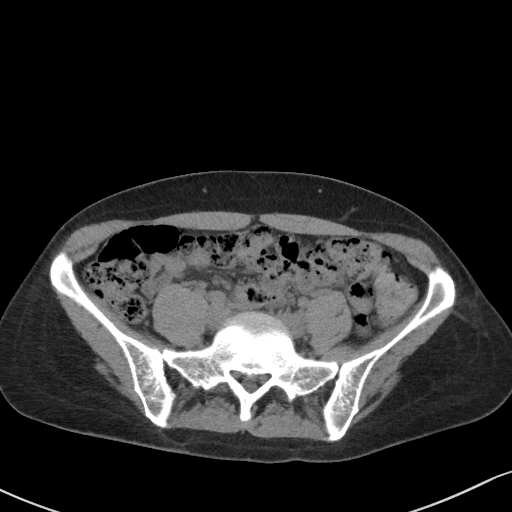
[im 52/94  soft-tissue]
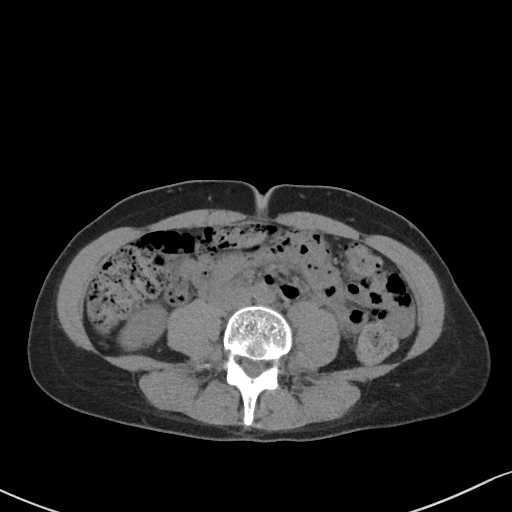
[im 57/94  soft-tissue]
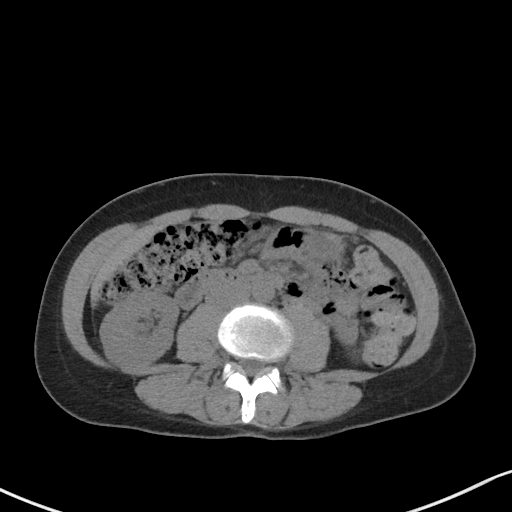
[im 68/94  soft-tissue]
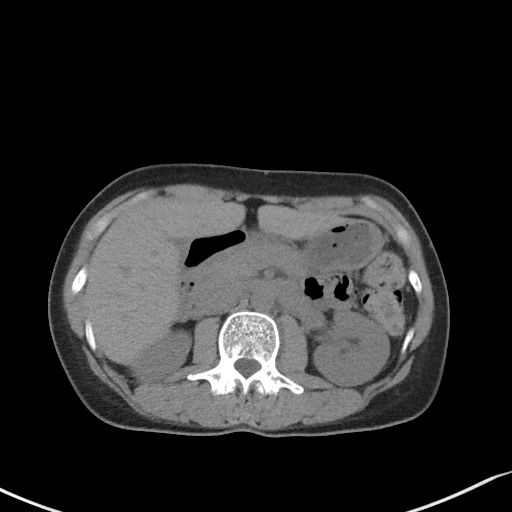
[im 68/94  bone]
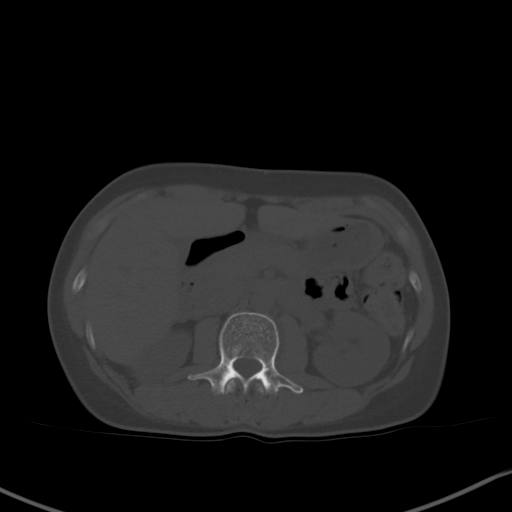
[im 73/94  soft-tissue]
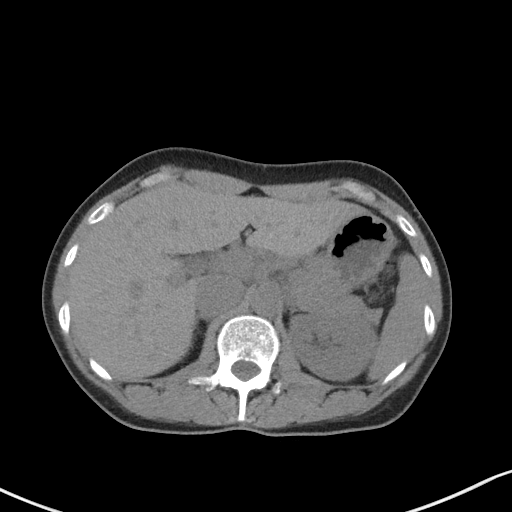
[im 78/94  soft-tissue]
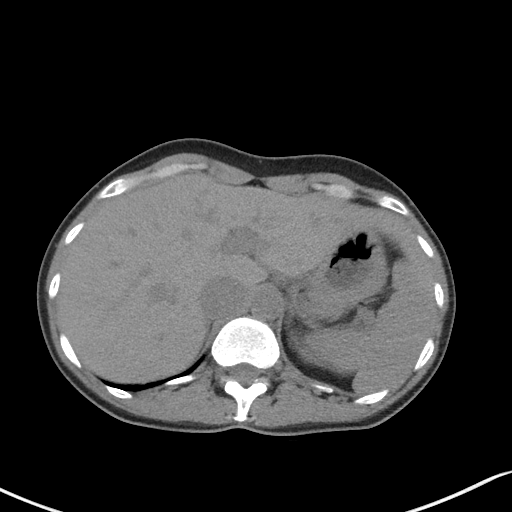
[im 88/94  soft-tissue]
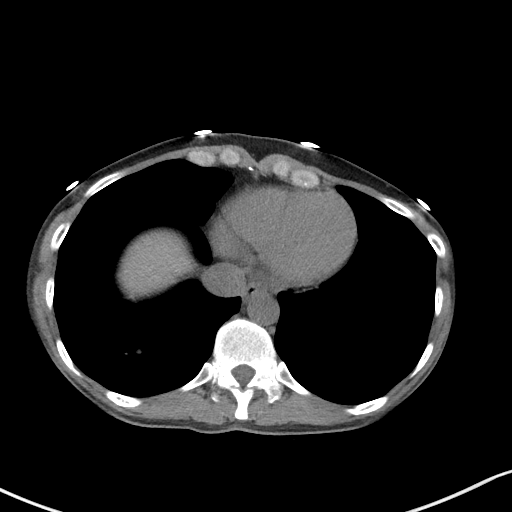

[Series 5: coronal st · coronal · 0.52mm/px · 3 of 82 slices shown]
[im 28/82  soft-tissue]
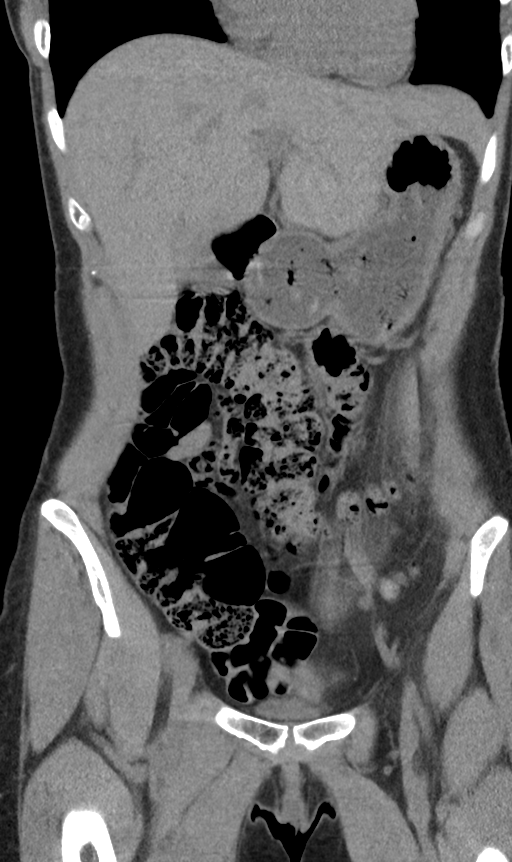
[im 37/82  soft-tissue]
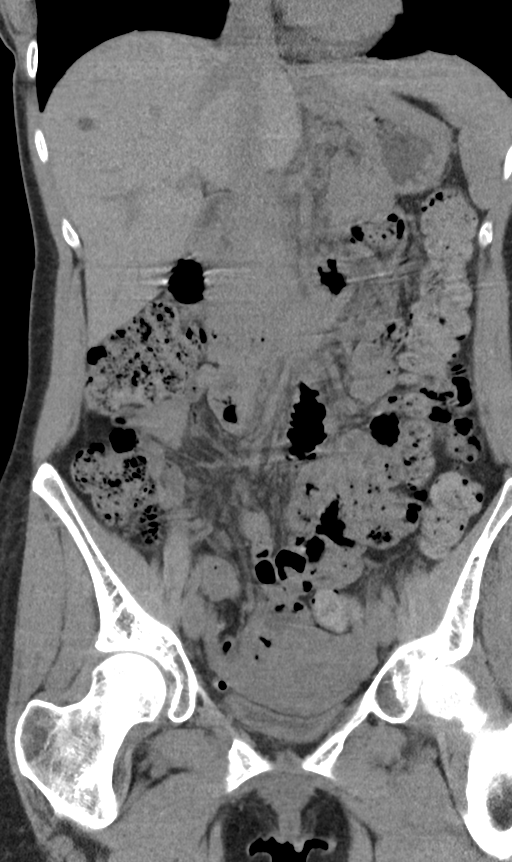
[im 46/82  soft-tissue]
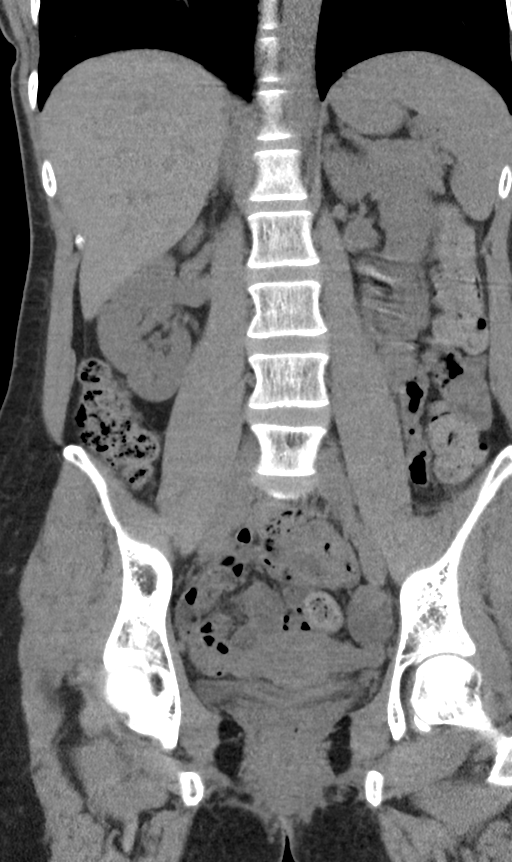

[15 of 46 positions shown; findings below may reference images not displayed]

FINDINGS: Evaluation of this exam is limited in the absence of intravenous
contrast.

Lower chest: The visualized lung bases are clear.

No intra-abdominal free air or free fluid.

Hepatobiliary: There is a 1 cm right liver hypodensity which is
suboptimally characterized on this noncontrast CT but demonstrates
fluid attenuation most consistent with a cyst. The liver is
otherwise unremarkable. No intrahepatic biliary ductal dilatation.
No calcified gallstone.

Pancreas: Unremarkable. No pancreatic ductal dilatation or
surrounding inflammatory changes.

Spleen: Normal in size without focal abnormality.

Adrenals/Urinary Tract: Adrenal glands unremarkable. The there is no
hydronephrosis or nephrolithiasis on either side. The visualized
ureters and urinary bladder appear unremarkable.

Stomach/Bowel: Moderate stool throughout the colon. There is no
bowel obstruction or active inflammation. Normal appendix.

Vascular/Lymphatic: The abdominal aorta and IVC unremarkable. No
portal venous gas. There is no adenopathy.

Reproductive: The uterus is anteverted and grossly unremarkable. No
adnexal masses.

Other: None

Musculoskeletal: No acute or significant osseous findings.
IMPRESSION: 1. No acute intra-abdominal or pelvic pathology. No hydronephrosis
or nephrolithiasis.
2. Moderate colonic stool burden. No bowel obstruction. Normal
appendix.

## 2021-08-20 ENCOUNTER — Ambulatory Visit (INDEPENDENT_AMBULATORY_CARE_PROVIDER_SITE_OTHER): Payer: 59

## 2021-08-20 ENCOUNTER — Other Ambulatory Visit: Payer: Self-pay

## 2021-08-20 ENCOUNTER — Ambulatory Visit (INDEPENDENT_AMBULATORY_CARE_PROVIDER_SITE_OTHER): Payer: 59 | Admitting: Orthopaedic Surgery

## 2021-08-20 ENCOUNTER — Encounter: Payer: Self-pay | Admitting: Orthopaedic Surgery

## 2021-08-20 DIAGNOSIS — M25571 Pain in right ankle and joints of right foot: Secondary | ICD-10-CM

## 2021-08-20 NOTE — Progress Notes (Signed)
? ?Office Visit Note ?  ?Patient: Debra Watts           ?Date of Birth: November 15, 1975           ?MRN: [id number] ?Visit Date: 08/20/2021 ?             ?Requested by: Redmond School, MD ?[address] ?[place],  [place] [address] ?PCP: Redmond School, MD ? ? ?Assessment & Plan: ?Visit Diagnoses: No diagnosis found. ? ?Plan: Debra Watts is a pleasant 46 year old woman who 2 weeks ago woke up and had acute onset of pain along her Achilles tendon on her right ankle.  She denies any change in activity any change in shoewear she denies any activities that may have provoked this and has not had difficulty with in the past.  It was severe enough that she had difficulty placing weight on her ankle when she got out of bed.  She admits today it is much better and really does not have any symptoms.  With exam findings most likely she had an issue with her Achilles based on where she said she did hurt.  There is no bruising no swelling and she has good strength with her Achilles and painful symptoms could not be reproduced.  We did tell her if this returns to call us immediately would be happy to see her so we could evaluate her when she is having pain ? ?Follow-Up Instructions: No follow-ups on file.  ? ?Orders:  ?No orders of the defined types were placed in this encounter. ? ?No orders of the defined types were placed in this encounter. ? ? ? ? Procedures: ?No procedures performed ? ? ?Clinical Data: ?No additional findings. ? ? ?Subjective: ?Chief Complaint  ?Patient presents with  ? Right Ankle - Pain  ?Patient presents today for right ankle pain. She said that she woke two weeks ago with "popping and cracking in the Achilles". She was unable to walk normally for two hours after onset. She said that she has been bracing her ankle. She has noticed lots of improvement. She has taken Advil as needed. ? ? ?Review of Systems  ?All other systems reviewed and are negative. ? ? ?Objective: ?Vital Signs: There were no vitals taken  for this visit. ? ?Physical Exam ?Constitutional:   ?   Appearance: Normal appearance.  ?Pulmonary:  ?   Effort: Pulmonary effort is normal.  ?Skin: ?   General: Skin is warm and dry.  ?Neurological:  ?   General: No focal deficit present.  ?   Mental Status: She is alert.  ? ? ?Ortho Exam ?Examination of her right ankle she has no effusion no swelling no ecchymosis.  She has excellent strength with dorsiflexion plantarflexion eversion and inversion.  No pain with range of motion in the ankle.  No tenderness in the calf no tenderness in the knee.  She has no calf swelling.  Achilles is intact.  No tenderness along the posterior tibial tendon or the peroneal tendon.  No erythema or warmth ?Specialty Comments:  ?No specialty comments available. ? ?Imaging: ?No results found. ? ? ?PMFS History: ?Patient Active Problem List  ? Diagnosis Date Noted  ? Bilateral carpal tunnel syndrome 09/27/2020  ? Low back pain 07/25/2020  ? GAD (generalized anxiety disorder) 02/15/2018  ? ?No past medical history on file.  ?No family history on file.  ?No past surgical history on file. ?Social History  ? ?Occupational History  ? Not on file  ?Tobacco Use  ?  Smoking status: Never  ? Smokeless tobacco: Never  ?Substance and Sexual Activity  ? Alcohol use: No  ? Drug use: No  ? Sexual activity: Not on file  ? ? ? ? ? ? ?

## 2021-11-25 DIAGNOSIS — M5412 Radiculopathy, cervical region: Secondary | ICD-10-CM | POA: Diagnosis not present

## 2021-11-25 DIAGNOSIS — M47812 Spondylosis without myelopathy or radiculopathy, cervical region: Secondary | ICD-10-CM | POA: Diagnosis not present

## 2021-11-25 DIAGNOSIS — Z6820 Body mass index (BMI) 20.0-20.9, adult: Secondary | ICD-10-CM | POA: Diagnosis not present

## 2021-11-25 DIAGNOSIS — S161XXA Strain of muscle, fascia and tendon at neck level, initial encounter: Secondary | ICD-10-CM | POA: Diagnosis not present

## 2021-12-02 DIAGNOSIS — Z01411 Encounter for gynecological examination (general) (routine) with abnormal findings: Secondary | ICD-10-CM | POA: Diagnosis not present

## 2021-12-02 DIAGNOSIS — Z1231 Encounter for screening mammogram for malignant neoplasm of breast: Secondary | ICD-10-CM | POA: Diagnosis not present

## 2021-12-02 DIAGNOSIS — Z01419 Encounter for gynecological examination (general) (routine) without abnormal findings: Secondary | ICD-10-CM | POA: Diagnosis not present

## 2021-12-02 DIAGNOSIS — Z124 Encounter for screening for malignant neoplasm of cervix: Secondary | ICD-10-CM | POA: Diagnosis not present

## 2021-12-02 DIAGNOSIS — N871 Moderate cervical dysplasia: Secondary | ICD-10-CM | POA: Diagnosis not present

## 2021-12-02 DIAGNOSIS — R69 Illness, unspecified: Secondary | ICD-10-CM | POA: Diagnosis not present

## 2021-12-02 DIAGNOSIS — Z0142 Encounter for cervical smear to confirm findings of recent normal smear following initial abnormal smear: Secondary | ICD-10-CM | POA: Diagnosis not present

## 2022-03-17 DIAGNOSIS — Z1212 Encounter for screening for malignant neoplasm of rectum: Secondary | ICD-10-CM | POA: Diagnosis not present

## 2022-03-17 DIAGNOSIS — Z1211 Encounter for screening for malignant neoplasm of colon: Secondary | ICD-10-CM | POA: Diagnosis not present

## 2022-03-24 LAB — EXTERNAL GENERIC LAB PROCEDURE: COLOGUARD: NEGATIVE

## 2022-03-24 LAB — COLOGUARD: COLOGUARD: NEGATIVE

## 2022-07-07 DIAGNOSIS — Z681 Body mass index (BMI) 19 or less, adult: Secondary | ICD-10-CM | POA: Diagnosis not present

## 2022-07-07 DIAGNOSIS — R300 Dysuria: Secondary | ICD-10-CM | POA: Diagnosis not present

## 2023-01-08 DIAGNOSIS — Z1231 Encounter for screening mammogram for malignant neoplasm of breast: Secondary | ICD-10-CM | POA: Diagnosis not present

## 2023-01-08 DIAGNOSIS — Z01419 Encounter for gynecological examination (general) (routine) without abnormal findings: Secondary | ICD-10-CM | POA: Diagnosis not present

## 2023-02-09 DIAGNOSIS — Z0001 Encounter for general adult medical examination with abnormal findings: Secondary | ICD-10-CM | POA: Diagnosis not present

## 2023-02-09 DIAGNOSIS — Z681 Body mass index (BMI) 19 or less, adult: Secondary | ICD-10-CM | POA: Diagnosis not present

## 2023-02-09 DIAGNOSIS — Z1331 Encounter for screening for depression: Secondary | ICD-10-CM | POA: Diagnosis not present

## 2023-02-23 DIAGNOSIS — J042 Acute laryngotracheitis: Secondary | ICD-10-CM | POA: Diagnosis not present

## 2023-02-23 DIAGNOSIS — Z681 Body mass index (BMI) 19 or less, adult: Secondary | ICD-10-CM | POA: Diagnosis not present

## 2023-03-30 ENCOUNTER — Other Ambulatory Visit (HOSPITAL_COMMUNITY): Payer: Self-pay | Admitting: Family Medicine

## 2023-03-30 ENCOUNTER — Ambulatory Visit (HOSPITAL_COMMUNITY)
Admission: RE | Admit: 2023-03-30 | Discharge: 2023-03-30 | Disposition: A | Discharge status: Home / Self Care | Payer: 59 | Source: Ambulatory Visit | Attending: Family Medicine | Admitting: Family Medicine

## 2023-03-30 DIAGNOSIS — S2341XA Sprain of ribs, initial encounter: Secondary | ICD-10-CM

## 2023-03-30 DIAGNOSIS — Z681 Body mass index (BMI) 19 or less, adult: Secondary | ICD-10-CM | POA: Diagnosis not present

## 2023-08-24 DIAGNOSIS — Z681 Body mass index (BMI) 19 or less, adult: Secondary | ICD-10-CM | POA: Diagnosis not present

## 2023-08-24 DIAGNOSIS — J0110 Acute frontal sinusitis, unspecified: Secondary | ICD-10-CM | POA: Diagnosis not present

## 2023-09-07 DIAGNOSIS — M47812 Spondylosis without myelopathy or radiculopathy, cervical region: Secondary | ICD-10-CM | POA: Diagnosis not present

## 2023-09-07 DIAGNOSIS — M5412 Radiculopathy, cervical region: Secondary | ICD-10-CM | POA: Diagnosis not present

## 2023-09-07 DIAGNOSIS — Z6820 Body mass index (BMI) 20.0-20.9, adult: Secondary | ICD-10-CM | POA: Diagnosis not present

## 2023-09-24 DIAGNOSIS — H9042 Sensorineural hearing loss, unilateral, left ear, with unrestricted hearing on the contralateral side: Secondary | ICD-10-CM | POA: Diagnosis not present

## 2023-09-24 DIAGNOSIS — H9311 Tinnitus, right ear: Secondary | ICD-10-CM | POA: Diagnosis not present

## 2023-09-29 ENCOUNTER — Other Ambulatory Visit (HOSPITAL_COMMUNITY): Payer: Self-pay | Admitting: Internal Medicine

## 2023-09-29 DIAGNOSIS — H9313 Tinnitus, bilateral: Secondary | ICD-10-CM

## 2023-10-01 ENCOUNTER — Other Ambulatory Visit: Payer: Self-pay | Admitting: Internal Medicine

## 2023-10-01 DIAGNOSIS — H9313 Tinnitus, bilateral: Secondary | ICD-10-CM

## 2023-10-02 DIAGNOSIS — W57XXXA Bitten or stung by nonvenomous insect and other nonvenomous arthropods, initial encounter: Secondary | ICD-10-CM | POA: Diagnosis not present

## 2023-10-02 DIAGNOSIS — R519 Headache, unspecified: Secondary | ICD-10-CM | POA: Diagnosis not present

## 2023-10-05 ENCOUNTER — Encounter (HOSPITAL_COMMUNITY): Payer: Self-pay

## 2023-10-05 ENCOUNTER — Ambulatory Visit (HOSPITAL_COMMUNITY)

## 2023-10-12 ENCOUNTER — Other Ambulatory Visit

## 2023-10-29 DIAGNOSIS — H9313 Tinnitus, bilateral: Secondary | ICD-10-CM | POA: Diagnosis not present

## 2023-10-29 DIAGNOSIS — H9311 Tinnitus, right ear: Secondary | ICD-10-CM | POA: Diagnosis not present

## 2023-10-29 DIAGNOSIS — H9042 Sensorineural hearing loss, unilateral, left ear, with unrestricted hearing on the contralateral side: Secondary | ICD-10-CM | POA: Diagnosis not present

## 2023-10-29 DIAGNOSIS — H9041 Sensorineural hearing loss, unilateral, right ear, with unrestricted hearing on the contralateral side: Secondary | ICD-10-CM | POA: Diagnosis not present

## 2023-11-10 ENCOUNTER — Encounter: Payer: Self-pay | Admitting: Neurology

## 2024-01-01 NOTE — Progress Notes (Signed)
 Initial neurology clinic note  Debra Watts MRN: [id number] DOB: 06-11-1975  Referring provider: Bertell Satterfield, MD  Primary care provider: Bertell Satterfield, MD  Reason for consult:  headaches  Subjective:  This is [name], a 48 y.o. right-handed female with a medical history of lumbar radiculopathy s/p surgery (2020) and anxiety who presents to neurology clinic with headaches and tinnitus. The patient is alone today.  Patient's right ear started hurting in 08/2023. She saw [facility] for concern for ear infection. She was given antibiotics. She then saw PCP who give prednisone. Nothing helped. She then saw ENT because she started having ringing in her right ear. The pain has since went away. 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 MRI temporal bone and IAC w/wo contrast on 10/29/23 that showed several non-specific punctate T2 hyperintensities in the white matter.  Of note, patient had frequent ear infections as a child and had tubes placed in ears.  Of note, patient had a tick bite in 09/2023. She did not get any rash. After she developed a headache. It was a constant throbbing, right sided headache that lasted 2 weeks. It would not respond to any medication until after 2 weeks it just went away. Patient was put on doxycycline for the tick bite. She does not really have headaches previously. She denies auras without headaches.  Other than the lumbar radiculopathy, she has never had focal neurologic deficits, including vision loss, numbness, tingling, or weakness.  Smoker: No OCP use: No EtOH use: No Restrictive diet: No Family history of neurologic disease, including headaches: No, but a lot of autoimmune diseases, including sister who had lupus and Wilson's disease (passed away)  MEDICATIONS:  Outpatient Encounter Medications as of 01/13/2024  Medication Sig   chlordiazePOXIDE (LIBRIUM) 5 MG capsule Take 5 mg by  mouth.   [DISCONTINUED] clindamycin (CLEOCIN) 300 MG capsule Take 300 mg by mouth 2 (two) times daily.   [DISCONTINUED] gabapentin  (NEURONTIN ) 300 MG capsule Take 1 capsule by mouth at night for 7 nights and then 1 in the morning and one at night for 7 days and then 3 times a day.   [DISCONTINUED] phenazopyridine (PYRIDIUM) 200 MG tablet Take 200 mg by mouth 3 (three) times daily as needed.   [DISCONTINUED] sulfamethoxazole-trimethoprim (BACTRIM DS) 800-160 MG tablet SMARTSIG:1 Tablet(s) By Mouth Every 12 Hours   No facility-administered encounter medications on file as of 01/13/2024.    PAST MEDICAL HISTORY: Past Medical History:  Diagnosis Date   Headache    Tinnitus     PAST SURGICAL HISTORY: Past Surgical History:  Procedure Laterality Date   BACK SURGERY      ALLERGIES: Allergies  Allergen Reactions   Prednisone Shortness Of Breath   Hydrocodone    Penicillins     FAMILY HISTORY: History reviewed. No pertinent family history.  SOCIAL HISTORY: Social History   Tobacco Use   Smoking status: Never   Smokeless tobacco: Never  Substance Use Topics   Alcohol use: No   Drug use: No   Social History   Social History Narrative   Right handed   Lives with husband   One floor home   Employed   Drinks caffeine    Objective:  Vital Signs:  BP 109/70   Pulse 62   Resp 20   Ht 5' 6 (1.676 m)   Wt 122 lb (55.3 kg)   SpO2 97%   BMI 19.69 kg/m  General: No acute distress.  Patient appears well-groomed.   Head:  Normocephalic/atraumatic Eyes:  fundi examined, disc margins clear, no obvious papilledema Neck: supple, no paraspinal tenderness, full range of motion Heart: regular rate and rhythm Lungs: Clear to auscultation bilaterally. Vascular: No carotid bruits.  Neurological Exam: Mental status: alert and oriented, speech fluent and not dysarthric, language intact.  Cranial nerves: CN I: not tested CN II: pupils equal, round and reactive to light,  visual fields intact. No APD. CN III, IV, VI:  full range of motion, no nystagmus, no ptosis CN V: facial sensation intact. CN VII: upper and lower face symmetric CN VIII: hearing intact CN IX, X: uvula midline CN XI: sternocleidomastoid and trapezius muscles intact CN XII: tongue midline  Bulk & Tone: normal, no fasciculations. Motor:  muscle strength 5/5 throughout Deep Tendon Reflexes:  2+ throughout,  toes downgoing.   Sensation: Vibratory sensation intact. Finger to nose testing:  Without dysmetria.   Gait:  Normal station and stride.  Romberg negative.   Labs and Imaging review: Internal labs: 08/12/20: CBC unremarkable CMP unremarkable  Imaging/Procedures: MRI lumbar spine wo contrast (07/30/20): IMPRESSION: Left L5-S1 paracentral protrusion with abutment of the left greater than right S1 nerve roots.   Severe spinal canal, severe left and mild-to-moderate right neural foraminal narrowing at the L5-S1 level.   Left L4-5 extraforaminal annular fissuring with mild bilateral neural foraminal narrowing.  MRI temporal bone and IAC w/wo contrast (10/29/23 - external, I do not have the images to review): TECHNIQUE: Multiplanar multisequence MR imaging of the brain was obtained before and after intravenous administration of gadolinium-based contrast, including axial and coronal thin-section images of the petrous temporal bones. By design, the brain was incompletely imaged.   FINDINGS:   Skull base/soft tissues: No focal marrow replacing lesion. Mastoid air cells and middle ears are normally aerated bilaterally.   Brainstem/cerebellum: No cerebellopontine angle mass.   Internal auditory canals/inner ears: No mass.   Imaged brain: No evidence of acute infarct. No acute hemorrhage, mass effect, or hydrocephalus. There are several punctate T2/FLAIR hyperintensities predominantly in the frontal periventricular and subcortical white matter.   IMPRESSION:  1. No evidence of  retrocochlear pathology.  2.  Several punctate T2/FLAIR hyperintensities predominantly in the frontal periventricular and subcortical white matter are nonspecific in etiology, but typically attributed to prior trauma, inflammation, demyelination versus migraines or the sequela of chronic small vessel disease.   Assessment/Plan:  Debra Watts is a 48 y.o. female who presents for evaluation of tinnitus and abnormal MRI brain. She has a relevant medical history of lumbar radiculopathy s/p surgery (2020) and anxiety. Her neurological examination is essentially normal. Available diagnostic data is significant for MRI of temporal bones and IAC showing several punctate T2 hyperintensities. Unfortunately, I do not have the images to review myself, but radiology commented this was non-specific and punctate. This was not a complete MRI brain though, so full details of the brain are not known. This can be common in patients with migraine or small vessel disease. She does not have known migraines and has no known cardiovascular risk factors for small vessel disease. Multiple sclerosis is often also associated with white matter disease, but this is less likely given punctate description and no previous symptoms. I did offer MRI brain to be sure, but patient preferred to defer this and monitor. Tinnitus is likely secondary to hearing loss.   PLAN: -MRI brain w/wo contrast discussed, but patient prefers to defer and monitor symptoms -Would follow  up with ENT if tinnitus is bothering her  -Return to clinic as needed  The impression above as well as the plan as outlined below were extensively discussed with the patient who voiced understanding. All questions were answered to their satisfaction.  When available, results of the above investigations and possible further recommendations will be communicated to the patient via telephone/MyChart. Patient to call office if not contacted after expected testing turnaround  time.   Total time spent reviewing records, interview, history/exam, documentation, and coordination of care on day of encounter:  45 min   Thank you for allowing me to participate in patient's care.  If I can answer any additional questions, I would be pleased to do so.  Venetia Potters, MD   CC: Bertell Satterfield, MD [address] [place] [address]  CC: Referring provider: Bertell Satterfield, MD [address] [place],  [place] [address]

## 2024-01-13 ENCOUNTER — Encounter: Payer: Self-pay | Admitting: Neurology

## 2024-01-13 ENCOUNTER — Ambulatory Visit: Admitting: Neurology

## 2024-01-13 VITALS — BP 109/70 | HR 62 | Resp 20 | Ht 66.0 in | Wt 122.0 lb

## 2024-01-13 DIAGNOSIS — H905 Unspecified sensorineural hearing loss: Secondary | ICD-10-CM

## 2024-01-13 DIAGNOSIS — H9311 Tinnitus, right ear: Secondary | ICD-10-CM | POA: Diagnosis not present

## 2024-01-13 DIAGNOSIS — M5126 Other intervertebral disc displacement, lumbar region: Secondary | ICD-10-CM | POA: Insufficient documentation

## 2024-01-13 DIAGNOSIS — R9089 Other abnormal findings on diagnostic imaging of central nervous system: Secondary | ICD-10-CM

## 2024-01-13 DIAGNOSIS — M5416 Radiculopathy, lumbar region: Secondary | ICD-10-CM | POA: Insufficient documentation

## 2024-01-13 NOTE — Patient Instructions (Signed)
 I saw you today for ringing in your ear and the MRI that suggested small bright spots on the brain. These bright spots can be present for many reasons, but you do not have risk factors for small vessel disease, no history of headaches, and your history and examination are not concerning for MS. We discussed getting a dedicated MRI of your brain to evaluate further but you prefer to monitor and hold off on this, which is reasonable.  If you change your mind or have new or worsening symptoms, please reach out to me.  The physicians and staff at [facility] are committed to providing excellent care. You may receive a survey requesting feedback about your experience at our office. We strive to receive very good responses to the survey questions. If you feel that your experience would prevent you from giving the office a very good  response, please contact our office to try to remedy the situation. We may be reached at [phone]. Thank you for taking the time out of your busy day to complete the survey.  Venetia Potters, MD [facility]

## 2024-01-14 DIAGNOSIS — F419 Anxiety disorder, unspecified: Secondary | ICD-10-CM | POA: Diagnosis not present

## 2024-01-14 DIAGNOSIS — M47812 Spondylosis without myelopathy or radiculopathy, cervical region: Secondary | ICD-10-CM | POA: Diagnosis not present

## 2024-02-08 DIAGNOSIS — Z01419 Encounter for gynecological examination (general) (routine) without abnormal findings: Secondary | ICD-10-CM | POA: Diagnosis not present

## 2024-02-08 DIAGNOSIS — Z124 Encounter for screening for malignant neoplasm of cervix: Secondary | ICD-10-CM | POA: Diagnosis not present

## 2024-02-08 DIAGNOSIS — Z01411 Encounter for gynecological examination (general) (routine) with abnormal findings: Secondary | ICD-10-CM | POA: Diagnosis not present

## 2024-02-08 DIAGNOSIS — Z1331 Encounter for screening for depression: Secondary | ICD-10-CM | POA: Diagnosis not present

## 2024-02-08 DIAGNOSIS — Z1231 Encounter for screening mammogram for malignant neoplasm of breast: Secondary | ICD-10-CM | POA: Diagnosis not present

## 2024-02-08 DIAGNOSIS — Z113 Encounter for screening for infections with a predominantly sexual mode of transmission: Secondary | ICD-10-CM | POA: Diagnosis not present

## 2024-03-25 DIAGNOSIS — M9902 Segmental and somatic dysfunction of thoracic region: Secondary | ICD-10-CM | POA: Diagnosis not present

## 2024-03-25 DIAGNOSIS — H9311 Tinnitus, right ear: Secondary | ICD-10-CM | POA: Diagnosis not present

## 2024-03-25 DIAGNOSIS — M9901 Segmental and somatic dysfunction of cervical region: Secondary | ICD-10-CM | POA: Diagnosis not present

## 2024-04-04 DIAGNOSIS — M9901 Segmental and somatic dysfunction of cervical region: Secondary | ICD-10-CM | POA: Diagnosis not present

## 2024-04-04 DIAGNOSIS — H9311 Tinnitus, right ear: Secondary | ICD-10-CM | POA: Diagnosis not present

## 2024-04-04 DIAGNOSIS — M9902 Segmental and somatic dysfunction of thoracic region: Secondary | ICD-10-CM | POA: Diagnosis not present

## 2024-04-11 DIAGNOSIS — M9901 Segmental and somatic dysfunction of cervical region: Secondary | ICD-10-CM | POA: Diagnosis not present

## 2024-04-11 DIAGNOSIS — M9902 Segmental and somatic dysfunction of thoracic region: Secondary | ICD-10-CM | POA: Diagnosis not present

## 2024-04-11 DIAGNOSIS — H9311 Tinnitus, right ear: Secondary | ICD-10-CM | POA: Diagnosis not present

## 2024-04-18 DIAGNOSIS — M9901 Segmental and somatic dysfunction of cervical region: Secondary | ICD-10-CM | POA: Diagnosis not present

## 2024-04-18 DIAGNOSIS — H9311 Tinnitus, right ear: Secondary | ICD-10-CM | POA: Diagnosis not present

## 2024-04-18 DIAGNOSIS — M9902 Segmental and somatic dysfunction of thoracic region: Secondary | ICD-10-CM | POA: Diagnosis not present

## 2024-04-25 DIAGNOSIS — M9902 Segmental and somatic dysfunction of thoracic region: Secondary | ICD-10-CM | POA: Diagnosis not present

## 2024-04-25 DIAGNOSIS — H9311 Tinnitus, right ear: Secondary | ICD-10-CM | POA: Diagnosis not present

## 2024-04-25 DIAGNOSIS — M9901 Segmental and somatic dysfunction of cervical region: Secondary | ICD-10-CM | POA: Diagnosis not present

## 2024-05-09 DIAGNOSIS — M9901 Segmental and somatic dysfunction of cervical region: Secondary | ICD-10-CM | POA: Diagnosis not present

## 2024-05-09 DIAGNOSIS — M9902 Segmental and somatic dysfunction of thoracic region: Secondary | ICD-10-CM | POA: Diagnosis not present

## 2024-05-09 DIAGNOSIS — H9311 Tinnitus, right ear: Secondary | ICD-10-CM | POA: Diagnosis not present
